# Patient Record
Sex: Male | Born: 1970 | Race: White | Hispanic: No | Marital: Married | Smoking: Former smoker
Health system: Southern US, Community
[De-identification: ages and names within clinical notes are randomized; demographics above are authoritative.]

## PROBLEM LIST (undated history)

## (undated) DIAGNOSIS — F329 Major depressive disorder, single episode, unspecified: Secondary | ICD-10-CM

## (undated) DIAGNOSIS — R51 Headache: Secondary | ICD-10-CM

## (undated) DIAGNOSIS — Z872 Personal history of diseases of the skin and subcutaneous tissue: Secondary | ICD-10-CM

## (undated) DIAGNOSIS — J302 Other seasonal allergic rhinitis: Secondary | ICD-10-CM

## (undated) DIAGNOSIS — K760 Fatty (change of) liver, not elsewhere classified: Secondary | ICD-10-CM

## (undated) DIAGNOSIS — M1990 Unspecified osteoarthritis, unspecified site: Secondary | ICD-10-CM

## (undated) DIAGNOSIS — F419 Anxiety disorder, unspecified: Secondary | ICD-10-CM

## (undated) DIAGNOSIS — Z9889 Other specified postprocedural states: Secondary | ICD-10-CM

## (undated) DIAGNOSIS — I10 Essential (primary) hypertension: Secondary | ICD-10-CM

## (undated) DIAGNOSIS — L409 Psoriasis, unspecified: Secondary | ICD-10-CM

## (undated) DIAGNOSIS — D496 Neoplasm of unspecified behavior of brain: Secondary | ICD-10-CM

## (undated) DIAGNOSIS — N159 Renal tubulo-interstitial disease, unspecified: Secondary | ICD-10-CM

## (undated) DIAGNOSIS — R220 Localized swelling, mass and lump, head: Secondary | ICD-10-CM

## (undated) DIAGNOSIS — M7051 Other bursitis of knee, right knee: Secondary | ICD-10-CM

## (undated) DIAGNOSIS — Z87442 Personal history of urinary calculi: Secondary | ICD-10-CM

## (undated) HISTORY — DX: Neoplasm of unspecified behavior of brain: D49.6

## (undated) HISTORY — DX: Other seasonal allergic rhinitis: J30.2

## (undated) HISTORY — DX: Other bursitis of knee, right knee: M70.51

## (undated) HISTORY — DX: Anxiety disorder, unspecified: F41.9

## (undated) HISTORY — DX: Renal tubulo-interstitial disease, unspecified: N15.9

## (undated) HISTORY — DX: Other specified postprocedural states: Z98.890

## (undated) HISTORY — DX: Personal history of diseases of the skin and subcutaneous tissue: Z87.2

---
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
## 1997-12-10 DIAGNOSIS — R519 Headache, unspecified: Secondary | ICD-10-CM

## 1997-12-10 HISTORY — DX: Headache, unspecified: R51.9

## 2002-12-10 DIAGNOSIS — F419 Anxiety disorder, unspecified: Secondary | ICD-10-CM

## 2002-12-10 DIAGNOSIS — F32A Depression, unspecified: Secondary | ICD-10-CM

## 2002-12-10 DIAGNOSIS — I10 Essential (primary) hypertension: Secondary | ICD-10-CM

## 2002-12-10 HISTORY — DX: Essential (primary) hypertension: I10

## 2002-12-10 HISTORY — DX: Anxiety disorder, unspecified: F41.9

## 2002-12-10 HISTORY — DX: Depression, unspecified: F32.A

## 2007-12-11 DIAGNOSIS — J302 Other seasonal allergic rhinitis: Secondary | ICD-10-CM

## 2007-12-11 DIAGNOSIS — K760 Fatty (change of) liver, not elsewhere classified: Secondary | ICD-10-CM

## 2007-12-11 HISTORY — PX: HAND SURGERY: SHX662

## 2007-12-11 HISTORY — DX: Other seasonal allergic rhinitis: J30.2

## 2007-12-11 HISTORY — DX: Fatty (change of) liver, not elsewhere classified: K76.0

## 2012-07-03 ENCOUNTER — Ambulatory Visit: Payer: Self-pay | Admitting: Chiropractor

## 2012-12-10 DIAGNOSIS — M7051 Other bursitis of knee, right knee: Secondary | ICD-10-CM

## 2012-12-10 DIAGNOSIS — M1990 Unspecified osteoarthritis, unspecified site: Secondary | ICD-10-CM

## 2012-12-10 HISTORY — DX: Other bursitis of knee, right knee: M70.51

## 2012-12-10 HISTORY — DX: Unspecified osteoarthritis, unspecified site: M19.90

## 2013-12-10 DIAGNOSIS — Z87442 Personal history of urinary calculi: Secondary | ICD-10-CM

## 2013-12-10 DIAGNOSIS — L409 Psoriasis, unspecified: Secondary | ICD-10-CM

## 2013-12-10 HISTORY — DX: Psoriasis, unspecified: L40.9

## 2013-12-10 HISTORY — DX: Personal history of urinary calculi: Z87.442

## 2016-01-24 ENCOUNTER — Encounter: Payer: Self-pay | Admitting: Emergency Medicine

## 2016-01-24 ENCOUNTER — Emergency Department: Payer: PRIVATE HEALTH INSURANCE

## 2016-01-24 ENCOUNTER — Emergency Department
Admission: EM | Admit: 2016-01-24 | Discharge: 2016-01-24 | Disposition: A | Discharge status: Home / Self Care | Payer: PRIVATE HEALTH INSURANCE | Attending: Emergency Medicine | Admitting: Emergency Medicine

## 2016-01-24 DIAGNOSIS — N201 Calculus of ureter: Secondary | ICD-10-CM | POA: Diagnosis not present

## 2016-01-24 DIAGNOSIS — I10 Essential (primary) hypertension: Secondary | ICD-10-CM | POA: Insufficient documentation

## 2016-01-24 DIAGNOSIS — R109 Unspecified abdominal pain: Secondary | ICD-10-CM | POA: Diagnosis present

## 2016-01-24 DIAGNOSIS — Z87891 Personal history of nicotine dependence: Secondary | ICD-10-CM | POA: Diagnosis not present

## 2016-01-24 DIAGNOSIS — Z880 Allergy status to penicillin: Secondary | ICD-10-CM | POA: Diagnosis not present

## 2016-01-24 HISTORY — DX: Essential (primary) hypertension: I10

## 2016-01-24 LAB — URINALYSIS COMPLETE WITH MICROSCOPIC (ARMC ONLY)
BACTERIA UA: NONE SEEN
Bilirubin Urine: NEGATIVE
Glucose, UA: NEGATIVE mg/dL
KETONES UR: NEGATIVE mg/dL
Leukocytes, UA: NEGATIVE
Nitrite: NEGATIVE
PH: 6 (ref 5.0–8.0)
PROTEIN: NEGATIVE mg/dL
Specific Gravity, Urine: 1.014 (ref 1.005–1.030)

## 2016-01-24 LAB — BASIC METABOLIC PANEL
Anion gap: 6 (ref 5–15)
BUN: 17 mg/dL (ref 6–20)
CHLORIDE: 103 mmol/L (ref 101–111)
CO2: 29 mmol/L (ref 22–32)
CREATININE: 1.32 mg/dL — AB (ref 0.61–1.24)
Calcium: 9.3 mg/dL (ref 8.9–10.3)
GFR calc Af Amer: >60 mL/min (ref >60)
GFR calc non Af Amer: >60 mL/min (ref >60)
GLUCOSE: 103 mg/dL — AB (ref 65–99)
POTASSIUM: 4.3 mmol/L (ref 3.5–5.1)
Sodium: 138 mmol/L (ref 135–145)

## 2016-01-24 LAB — CBC
HCT: 45.5 % (ref 40.0–52.0)
HEMOGLOBIN: 15.7 g/dL (ref 13.0–18.0)
MCH: 31.3 pg (ref 26.0–34.0)
MCHC: 34.6 g/dL (ref 32.0–36.0)
MCV: 90.6 fL (ref 80.0–100.0)
PLATELETS: 509 10*3/uL — AB (ref 150–440)
RBC: 5.03 MIL/uL (ref 4.40–5.90)
RDW: 13 % (ref 11.5–14.5)
WBC: 14.8 10*3/uL — ABNORMAL HIGH (ref 3.8–10.6)

## 2016-01-24 MED ORDER — TAMSULOSIN HCL 0.4 MG PO CAPS: 0.4000 mg | ORAL_CAPSULE | Freq: Every day | ORAL | Status: DC

## 2016-01-24 MED ORDER — ONDANSETRON 4 MG PO TBDP
ORAL_TABLET | ORAL | Status: AC
  Filled 2016-01-24: qty 1

## 2016-01-24 MED ORDER — MORPHINE SULFATE (PF) 4 MG/ML IV SOLN
4.0000 mg | Freq: Once | INTRAVENOUS | Status: AC
  Administered 2016-01-24: 4 mg via INTRAVENOUS

## 2016-01-24 MED ORDER — MORPHINE SULFATE (PF) 4 MG/ML IV SOLN
INTRAVENOUS | Status: AC
  Administered 2016-01-24: 4 mg via INTRAVENOUS
  Filled 2016-01-24: qty 1

## 2016-01-24 MED ORDER — SODIUM CHLORIDE 0.9 % IV BOLUS (SEPSIS)
1000.0000 mL | Freq: Once | INTRAVENOUS | Status: AC
  Administered 2016-01-24: 1000 mL via INTRAVENOUS

## 2016-01-24 MED ORDER — KETOROLAC TROMETHAMINE 30 MG/ML IJ SOLN
INTRAMUSCULAR | Status: AC
Start: 2016-01-24 — End: 2016-01-24
  Administered 2016-01-24: 30 mg via INTRAVENOUS
  Filled 2016-01-24: qty 1

## 2016-01-24 MED ORDER — OXYCODONE-ACETAMINOPHEN 5-325 MG PO TABS: 1.0000 | ORAL_TABLET | Freq: Four times a day (QID) | ORAL | Status: DC | PRN

## 2016-01-24 MED ORDER — ONDANSETRON 4 MG PO TBDP
4.0000 mg | ORAL_TABLET | Freq: Once | ORAL | Status: AC
  Administered 2016-01-24: 4 mg via ORAL

## 2016-01-24 MED ORDER — KETOROLAC TROMETHAMINE 30 MG/ML IJ SOLN
30.0000 mg | Freq: Once | INTRAMUSCULAR | Status: AC
  Administered 2016-01-24: 30 mg via INTRAVENOUS

## 2016-01-24 NOTE — Discharge Instructions (Signed)
As we discussed please arrange an appointment with urology by calling the number provided if her symptoms do not resolve in the next several days. Please take your pain medication as prescribed, as needed. You may also take Aleve/Advil/Motrin. Return to the emergency department for any increased pain, vomiting, or fever.   Kidney Stones Kidney stones (urolithiasis) are deposits that form inside your kidneys. The intense pain is caused by the stone moving through the urinary tract. When the stone moves, the ureter goes into spasm around the stone. The stone is usually passed in the urine.  CAUSES   A disorder that makes certain neck glands produce too much parathyroid hormone (primary hyperparathyroidism).  A buildup of uric acid crystals, similar to gout in your joints.  Narrowing (stricture) of the ureter.  A kidney obstruction present at birth (congenital obstruction).  Previous surgery on the kidney or ureters.  Numerous kidney infections. SYMPTOMS   Feeling sick to your stomach (nauseous).  Throwing up (vomiting).  Blood in the urine (hematuria).  Pain that usually spreads (radiates) to the groin.  Frequency or urgency of urination. DIAGNOSIS   Taking a history and physical exam.  Blood or urine tests.  CT scan.  Occasionally, an examination of the inside of the urinary bladder (cystoscopy) is performed. TREATMENT   Observation.  Increasing your fluid intake.  Extracorporeal shock wave lithotripsy--This is a noninvasive procedure that uses shock waves to break up kidney stones.  Surgery may be needed if you have severe pain or persistent obstruction. There are various surgical procedures. Most of the procedures are performed with the use of small instruments. Only small incisions are needed to accommodate these instruments, so recovery time is minimized. The size, location, and chemical composition are all important variables that will determine the proper choice of  action for you. Talk to your health care provider to better understand your situation so that you will minimize the risk of injury to yourself and your kidney.  HOME CARE INSTRUCTIONS   Drink enough water and fluids to keep your urine clear or pale yellow. This will help you to pass the stone or stone fragments.  Strain all urine through the provided strainer. Keep all particulate matter and stones for your health care provider to see. The stone causing the pain may be as small as a grain of salt. It is very important to use the strainer each and every time you pass your urine. The collection of your stone will allow your health care provider to analyze it and verify that a stone has actually passed. The stone analysis will often identify what you can do to reduce the incidence of recurrences.  Only take over-the-counter or prescription medicines for pain, discomfort, or fever as directed by your health care provider.  Keep all follow-up visits as told by your health care provider. This is important.  Get follow-up X-rays if required. The absence of pain does not always mean that the stone has passed. It may have only stopped moving. If the urine remains completely obstructed, it can cause loss of kidney function or even complete destruction of the kidney. It is your responsibility to make sure X-rays and follow-ups are completed. Ultrasounds of the kidney can show blockages and the status of the kidney. Ultrasounds are not associated with any radiation and can be performed easily in a matter of minutes.  Make changes to your daily diet as told by your health care provider. You may be told to:  Limit the  amount of salt that you eat.  Eat 5 or more servings of fruits and vegetables each day.  Limit the amount of meat, poultry, fish, and eggs that you eat.  Collect a 24-hour urine sample as told by your health care provider.You may need to collect another urine sample every 6-12 months. SEEK  MEDICAL CARE IF:  You experience pain that is progressive and unresponsive to any pain medicine you have been prescribed. SEEK IMMEDIATE MEDICAL CARE IF:   Pain cannot be controlled with the prescribed medicine.  You have a fever or shaking chills.  The severity or intensity of pain increases over 18 hours and is not relieved by pain medicine.  You develop a new onset of abdominal pain.  You feel faint or pass out.  You are unable to urinate.   This information is not intended to replace advice given to you by your health care provider. Make sure you discuss any questions you have with your health care provider.   Document Released: 11/26/2005 Document Revised: 08/17/2015 Document Reviewed: 04/29/2013 Elsevier Interactive Patient Education [employer].

## 2016-01-24 NOTE — ED Provider Notes (Signed)
[facility] Emergency Department Provider Note  Time seen: 10:13 PM  I have reviewed the triage vital signs and the nursing notes.   HISTORY  Chief Complaint Flank Pain and Abdominal Pain    HPI Barry Rios is a 45 y.o. male with a past medical history of hypertension, kidney stones presents to the emergency department with right flank pain. According to the patient he developed right flank pain today, with nausea and vomiting. History of kidney stones in the past which this feels identical. Denies fever. Denies diarrhea. Denies hematuria but did state mild dysuria. Describes the pain as severe sharp stabbing pain to the right flank.     Past Medical History  Diagnosis Date  . Kidney stone   . Hypertension     There are no active problems to display for this patient.   Past Surgical History  Procedure Laterality Date  . Hand surgery      No current outpatient prescriptions on file.  Allergies Penicillins  No family history on file.  Social History Social History  Substance Use Topics  . Smoking status: Former Smoker    Types: Cigarettes  . Smokeless tobacco: None  . Alcohol Use: Yes     Comment: rare    Review of Systems Constitutional: Negative for fever. Cardiovascular: Negative for chest pain. Respiratory: Negative for shortness of breath. Gastrointestinal: Right flank pain. Positive for nausea and vomiting. Negative diarrhea Genitourinary: Mild dysuria. Negative for hematuria. Musculoskeletal: Negative for back pain. Neurological: Negative for headache 10-point ROS otherwise negative.  ____________________________________________   PHYSICAL EXAM:  VITAL SIGNS: ED Triage Vitals  Enc Vitals Group     BP 01/24/16 1857 178/93 mmHg     Pulse Rate 01/24/16 1857 72     Resp 01/24/16 1857 16     Temp 01/24/16 1857 97.9 F (36.6 C)     Temp Source 01/24/16 1857 Oral     SpO2 01/24/16 1857 99 %     Weight 01/24/16 1857 255  lb (115.667 kg)     Height 01/24/16 1857 6' (1.829 m)     Head Cir --      Peak Flow --      Pain Score 01/24/16 1857 8     Pain Loc --      Pain Edu? --      Excl. in [place]? --     Constitutional: Alert and oriented. Well appearing and in no distress. Eyes: Normal exam ENT   Head: Normocephalic and atraumatic.   Mouth/Throat: Mucous membranes are moist. Cardiovascular: Normal rate, regular rhythm. No murmur Respiratory: Normal respiratory effort without tachypnea nor retractions. Breath sounds are clear Gastrointestinal: Soft, minimal right flank tenderness palpation. Mild right CVA tenderness Musculoskeletal: Nontender with normal range of motion in all extremities Neurologic:  Normal speech and language. No gross focal neurologic deficits  Skin:  Skin is warm, dry and intact.  Psychiatric: Mood and affect are normal. Speech and behavior are normal.  ____________________________________________   INITIAL IMPRESSION / ASSESSMENT AND PLAN / ED COURSE  Pertinent labs & imaging results that were available during my care of the patient were reviewed by me and considered in my medical decision making (see chart for details).  Patient presents her right flank pain. Severe in nature, nausea and vomiting. Patient has a history of kidney stones in the past which this feels similar. Patient's labs show a mild creatinine elevation with leukocytosis. CT consistent with a 3 x 4 mm proximal right ureteral  stone. After Toradol and morphine the patient states his pain is gone. I discussed follow-up with urology, patient is agreeable. Discussed strict return precautions for worsening pain, vomiting, or fever. Patient agreeable.  ____________________________________________   FINAL CLINICAL IMPRESSION(S) / ED DIAGNOSES  Ureterolithiasis   Harvest Dark, MD 01/24/16 2217

## 2016-01-24 NOTE — ED Notes (Signed)
Lab called.  cbc redraw.  Iv started and pt placed in subwait.

## 2016-01-24 NOTE — ED Notes (Signed)
Right flank pain, RLQ pain, burning with urination and hx of kidney stones.

## 2016-02-02 ENCOUNTER — Encounter: Payer: Self-pay | Admitting: Obstetrics and Gynecology

## 2016-02-02 ENCOUNTER — Ambulatory Visit (INDEPENDENT_AMBULATORY_CARE_PROVIDER_SITE_OTHER): Payer: PRIVATE HEALTH INSURANCE | Admitting: Obstetrics and Gynecology

## 2016-02-02 VITALS — BP 138/84 | HR 71 | Ht 72.0 in | Wt 264.0 lb

## 2016-02-02 DIAGNOSIS — N200 Calculus of kidney: Secondary | ICD-10-CM

## 2016-02-02 NOTE — Progress Notes (Signed)
02/02/2016 3:31 PM   Barry Rios 1971-05-23 [id number]  Referring provider: Tamsen Roers, MD [address], [place] [address]  Chief Complaint  Patient presents with  . Nephrolithiasis    New Patient    HPI: Patient is a 45 year old male presenting today for follow-up after being seen in the emergency department on 01/24/16 for acute onset of right flank pain. Obstructing 3 x 4 mm proximal right ureteral stone with resultant mild hydronephrosis, renal edema and perinephric stranding. No additional nephro or ureterolithiasis identified.   This is 2nd kidney stone episode.    Continued right flank pain now radiating to right lower abdomen. No fevers no vomiting.   No gross hematuria.   Reports mild dysuria and frequency.  PMH: Past Medical History  Diagnosis Date  . Kidney stone   . Hypertension   . Anxiety   . Kidney infection     when he was a child- hospitalized    Surgical History: Past Surgical History  Procedure Laterality Date  . Hand surgery Left 2009    Home Medications:    Medication List       This list is accurate as of: 02/02/16  3:31 PM.  Always use your most recent med list.               GARLIC [id number] PO  Take by mouth.     KRILL OIL PO  Take by mouth.     multivitamin capsule  Take 1 capsule by mouth daily.     naproxen sodium 220 MG tablet  Commonly known as:  ANAPROX  Take 220 mg by mouth 2 (two) times daily with a meal.     oxyCODONE-acetaminophen 5-325 MG tablet  Commonly known as:  ROXICET  Take 1 tablet by mouth every 6 (six) hours as needed.     sertraline 100 MG tablet  Commonly known as:  ZOLOFT  Take 100 mg by mouth daily.     tamsulosin 0.4 MG Caps capsule  Commonly known as:  FLOMAX  Take 1 capsule (0.4 mg total) by mouth daily.        Allergies:  Allergies  Allergen Reactions  . Penicillins Hives    Family History: Family History  Problem Relation Age of Onset  . Bladder Cancer Neg Hx   . Kidney  cancer Neg Hx   . Prostate cancer Neg Hx   . Nephrolithiasis Brother     Social History:  reports that he has quit smoking. His smoking use included Cigarettes. He does not have any smokeless tobacco history on file. He reports that he drinks alcohol. He reports that he does not use illicit drugs.  ROS: UROLOGY Frequent Urination?: Yes Hard to postpone urination?: No Burning/pain with urination?: Yes Get up at night to urinate?: Yes Leakage of urine?: Yes Urine stream starts and stops?: No Trouble starting stream?: No Do you have to strain to urinate?: No Blood in urine?: No Urinary tract infection?: No Sexually transmitted disease?: No Injury to kidneys or bladder?: No Painful intercourse?: No Weak stream?: No Erection problems?: No Penile pain?: Yes  Gastrointestinal Nausea?: Yes Vomiting?: Yes Indigestion/heartburn?: No Diarrhea?: No Constipation?: Yes  Constitutional Fever: No Night sweats?: Yes Weight loss?: No Fatigue?: Yes  Skin Skin rash/lesions?: Yes Itching?: No  Eyes Blurred vision?: No Double vision?: No  Ears/Nose/Throat Sore throat?: No Sinus problems?: Yes  Hematologic/Lymphatic Swollen glands?: No Easy bruising?: No  Cardiovascular Leg swelling?: No Chest pain?: No  Respiratory Cough?: No  Shortness of breath?: No  Endocrine Excessive thirst?: Yes  Musculoskeletal Back pain?: No Joint pain?: Yes  Neurological Headaches?: No Dizziness?: No  Psychologic Depression?: Yes Anxiety?: Yes  Physical Exam: BP 138/84 mmHg  Pulse 71  Ht 6' (1.829 m)  Wt 264 lb (119.75 kg)  BMI 35.80 kg/m2  Constitutional:  Alert and oriented, No acute distress. HEENT: [place] AT, moist mucus membranes.  Trachea midline, no masses. Cardiovascular: No clubbing, cyanosis, or edema. Respiratory: Normal respiratory effort, no increased work of breathing. GI: Abdomen is soft, nontender, nondistended, no abdominal masses GU: mild right CVA  tenderness. Skin: No rashes, bruises or suspicious lesions. Lymph: No cervical or inguinal adenopathy. Neurologic: Grossly intact, no focal deficits, moving all 4 extremities. Psychiatric: Normal mood and affect.  Laboratory Data:  Lab Results  Component Value Date   WBC 14.8* 01/24/2016   HGB 15.7 01/24/2016   HCT 45.5 01/24/2016   MCV 90.6 01/24/2016   PLT 509* 01/24/2016    Lab Results  Component Value Date   CREATININE 1.32* 01/24/2016    No results found for: PSA  No results found for: TESTOSTERONE  No results found for: HGBA1C  Urinalysis    Component Value Date/Time   COLORURINE YELLOW* 01/24/2016 1926   APPEARANCEUR CLEAR* 01/24/2016 1926   LABSPEC 1.014 01/24/2016 1926   PHURINE 6.0 01/24/2016 1926   GLUCOSEU NEGATIVE 01/24/2016 1926   HGBUR 1+* 01/24/2016 1926   BILIRUBINUR NEGATIVE 01/24/2016 1926   KETONESUR NEGATIVE 01/24/2016 1926   PROTEINUR NEGATIVE 01/24/2016 1926   NITRITE NEGATIVE 01/24/2016 1926   LEUKOCYTESUR NEGATIVE 01/24/2016 1926    Pertinent Imaging: CLINICAL DATA: 45 year old male with right flank and right lower quadrant pain accompanied by this urea.  EXAM: CT ABDOMEN AND PELVIS WITHOUT CONTRAST  TECHNIQUE: Multidetector CT imaging of the abdomen and pelvis was performed following the standard protocol without IV contrast.  COMPARISON: Prior CT abdomen/ pelvis 04/05/2014  FINDINGS: Lower Chest: The lung bases are clear. Visualized cardiac structures are within normal limits for size. No pericardial effusion. Unremarkable visualized distal thoracic esophagus.  Abdomen: Unenhanced CT was performed per clinician order. Lack of IV contrast limits sensitivity and specificity, especially for evaluation of abdominal/pelvic solid viscera. Within these limitations, unremarkable CT appearance of the stomach, duodenum, spleen, adrenal glands and pancreas. Normal hepatic contour and morphology. Diffuse low attenuation of the  hepatic parenchyma consistent with at least moderate hepatic steatosis. Trace sparing around the gallbladder fossa. Gallbladder is unremarkable. No intra or extrahepatic biliary ductal dilatation.  Mild right hydronephrosis with renal edema and perinephric stranding. Approximately 3 by 4 mm stone in the proximal right ureter. No additional nephrolithiasis.  No evidence of obstruction or focal bowel wall thickening. Normal appendix in the right lower quadrant. The terminal ileum is unremarkable. No free fluid or suspicious adenopathy.  Pelvis: Unremarkable bladder, prostate gland and seminal vesicles. No free fluid or suspicious adenopathy.  Musculoskeletal: No acute fracture or aggressive appearing lytic or blastic osseous lesion.  Vascular: Limited evaluation in the absence of intravenous contrast. No aneurysm or significant atherosclerotic vascular calcification.  IMPRESSION: 1. Obstructing 3 x 4 mm proximal right ureteral stone with resultant mild hydronephrosis, renal edema and perinephric stranding. 2. No additional nephro or ureterolithiasis identified. 3. Moderate hepatic steatosis.  Electronically Signed  By: Jacqulynn Cadet M.D.  On: 01/24/2016 21:26  Assessment & Plan:    1. Nephrolithiasis- Obstructing 3 x 4 mm proximal right ureteral stone with resultant mild hydronephrosis, renal edema and perinephric stranding. Patient advised  to continue Flomax, pain medication as needed and to push fluids. He was advised to seek immediate medical attention for fevers, uncontrolled pain or vomiting. He will follow up in 2 weeks with a KUB and renal ultrasound prior. - Urinalysis, Complete   Return for have KUB/RUS performed in 2 weeks then f/u for results.  These notes generated with voice recognition software. I apologize for typographical errors.  Herbert Moors, [facility] [address], [place], [place]  [id number] [phone]

## 2016-02-02 NOTE — Patient Instructions (Signed)
Dietary Guidelines to Help Prevent Kidney Stones °Your risk of kidney stones can be decreased by adjusting the foods you eat. The most important thing you can do is drink enough fluid. You should drink enough fluid to keep your urine clear or pale yellow. The following guidelines provide specific information for the type of kidney stone you have had. °GUIDELINES ACCORDING TO TYPE OF KIDNEY STONE °Calcium Oxalate Kidney Stones °· Reduce the amount of salt you eat. Foods that have a lot of salt cause your body to release excess calcium into your urine. The excess calcium can combine with a substance called oxalate to form kidney stones. °· Reduce the amount of animal protein you eat if the amount you eat is excessive. Animal protein causes your body to release excess calcium into your urine. Ask your dietitian how much protein from animal sources you should be eating. °· Avoid foods that are high in oxalates. If you take vitamins, they should have less than 500 mg of vitamin C. Your body turns vitamin C into oxalates. You do not need to avoid fruits and vegetables high in vitamin C. °Calcium Phosphate Kidney Stones °· Reduce the amount of salt you eat to help prevent the release of excess calcium into your urine. °· Reduce the amount of animal protein you eat if the amount you eat is excessive. Animal protein causes your body to release excess calcium into your urine. Ask your dietitian how much protein from animal sources you should be eating. °· Get enough calcium from food or take a calcium supplement (ask your dietitian for recommendations). Food sources of calcium that do not increase your risk of kidney stones include: °· Broccoli. °· Dairy products, such as cheese and yogurt. °· Pudding. °Uric Acid Kidney Stones °· Do not have more than 6 oz of animal protein per day. °FOOD SOURCES °Animal Protein Sources °· Meat (all types). °· Poultry. °· Eggs. °· Fish, seafood. °Foods High in Salt °· Salt seasonings. °· Soy  sauce. °· Teriyaki sauce. °· Cured and processed meats. °· Salted crackers and snack foods. °· Fast food. °· Canned soups and most canned foods. °Foods High in Oxalates °· Grains: °· Amaranth. °· Barley. °· Grits. °· Wheat germ. °· Bran. °· Buckwheat flour. °· All bran cereals. °· Pretzels. °· Whole wheat bread. °· Vegetables: °· Beans (wax). °· Beets and beet greens. °· Collard greens. °· Eggplant. °· Escarole. °· Leeks. °· Okra. °· Parsley. °· Rutabagas. °· Spinach. °· Swiss chard. °· Tomato paste. °· Fried potatoes. °· Sweet potatoes. °· Fruits: °· Red currants. °· Figs. °· Kiwi. °· Rhubarb. °· Meat and Other Protein Sources: °· Beans (dried). °· Soy burgers and other soybean products. °· Miso. °· Nuts (peanuts, almonds, pecans, cashews, hazelnuts). °· Nut butters. °· Sesame seeds and tahini (paste made of sesame seeds). °· Poppy seeds. °· Beverages: °· Chocolate drink mixes. °· Soy milk. °· Instant iced tea. °· Juices made from high-oxalate fruits or vegetables. °· Other: °· Carob. °· Chocolate. °· Fruitcake. °· Marmalades. °  °This information is not intended to replace advice given to you by your health care provider. Make sure you discuss any questions you have with your health care provider. °  °Document Released: 03/23/2011 Document Revised: 12/01/2013 Document Reviewed: 10/23/2013 °Elsevier Interactive Patient Education ©2016 [employer] ° °Kidney Stones °Kidney stones (urolithiasis) are deposits that form inside your kidneys. The intense pain is caused by the stone moving through the urinary tract. When the stone moves, the ureter   goes into spasm around the stone. The stone is usually passed in the urine.  °CAUSES  °· A disorder that makes certain neck glands produce too much parathyroid hormone (primary hyperparathyroidism). °· A buildup of uric acid crystals, similar to gout in your joints. °· Narrowing (stricture) of the ureter. °· A kidney obstruction present at birth (congenital  obstruction). °· Previous surgery on the kidney or ureters. °· Numerous kidney infections. °SYMPTOMS  °· Feeling sick to your stomach (nauseous). °· Throwing up (vomiting). °· Blood in the urine (hematuria). °· Pain that usually spreads (radiates) to the groin. °· Frequency or urgency of urination. °DIAGNOSIS  °· Taking a history and physical exam. °· Blood or urine tests. °· CT scan. °· Occasionally, an examination of the inside of the urinary bladder (cystoscopy) is performed. °TREATMENT  °· Observation. °· Increasing your fluid intake. °· Extracorporeal shock wave lithotripsy--This is a noninvasive procedure that uses shock waves to break up kidney stones. °· Surgery may be needed if you have severe pain or persistent obstruction. There are various surgical procedures. Most of the procedures are performed with the use of small instruments. Only small incisions are needed to accommodate these instruments, so recovery time is minimized. °The size, location, and chemical composition are all important variables that will determine the proper choice of action for you. Talk to your health care provider to better understand your situation so that you will minimize the risk of injury to yourself and your kidney.  °HOME CARE INSTRUCTIONS  °· Drink enough water and fluids to keep your urine clear or pale yellow. This will help you to pass the stone or stone fragments. °· Strain all urine through the provided strainer. Keep all particulate matter and stones for your health care provider to see. The stone causing the pain may be as small as a grain of salt. It is very important to use the strainer each and every time you pass your urine. The collection of your stone will allow your health care provider to analyze it and verify that a stone has actually passed. The stone analysis will often identify what you can do to reduce the incidence of recurrences. °· Only take over-the-counter or prescription medicines for pain,  discomfort, or fever as directed by your health care provider. °· Keep all follow-up visits as told by your health care provider. This is important. °· Get follow-up X-rays if required. The absence of pain does not always mean that the stone has passed. It may have only stopped moving. If the urine remains completely obstructed, it can cause loss of kidney function or even complete destruction of the kidney. It is your responsibility to make sure X-rays and follow-ups are completed. Ultrasounds of the kidney can show blockages and the status of the kidney. Ultrasounds are not associated with any radiation and can be performed easily in a matter of minutes. °· Make changes to your daily diet as told by your health care provider. You may be told to: °¨ Limit the amount of salt that you eat. °¨ Eat 5 or more servings of fruits and vegetables each day. °¨ Limit the amount of meat, poultry, fish, and eggs that you eat. °· Collect a 24-hour urine sample as told by your health care provider. You may need to collect another urine sample every 6-12 months. °SEEK MEDICAL CARE IF: °· You experience pain that is progressive and unresponsive to any pain medicine you have been prescribed. °SEEK IMMEDIATE MEDICAL CARE IF:  °· Pain   Pain cannot be controlled with the prescribed medicine. °· You have a fever or shaking chills. °· The severity or intensity of pain increases over 18 hours and is not relieved by pain medicine. °· You develop a new onset of abdominal pain. °· You feel faint or pass out. °· You are unable to urinate. °  °This information is not intended to replace advice given to you by your health care provider. Make sure you discuss any questions you have with your health care provider. °  °Document Released: 11/26/2005 Document Revised: 08/17/2015 Document Reviewed: 04/29/2013 °Elsevier Interactive Patient Education ©2016 Else[employer] Inc[employer] ° °

## 2016-02-03 ENCOUNTER — Telehealth: Payer: Self-pay | Admitting: Radiology

## 2016-02-03 LAB — URINALYSIS, COMPLETE
BILIRUBIN UA: NEGATIVE
GLUCOSE, UA: NEGATIVE
KETONES UA: NEGATIVE
LEUKOCYTES UA: NEGATIVE
Nitrite, UA: NEGATIVE
PROTEIN UA: NEGATIVE
SPEC GRAV UA: 1.02 (ref 1.005–1.030)
Urobilinogen, Ur: 0.2 mg/dL (ref 0.2–1.0)
pH, UA: 5.5 (ref 5.0–7.5)

## 2016-02-03 LAB — MICROSCOPIC EXAMINATION
Bacteria, UA: NONE SEEN
EPITHELIAL CELLS (NON RENAL): NONE SEEN /HPF (ref 0–10)

## 2016-02-03 NOTE — Telephone Encounter (Signed)
Pt called stating he passed his stone last night & is no longer having any pain.  Per Dr Erlene Quan advised pt that he no longer needs a RUS & KUB & to RTC as needed. Pt voices understanding & states he saved the stone & will bring it to the office.

## 2016-02-16 ENCOUNTER — Ambulatory Visit: Payer: PRIVATE HEALTH INSURANCE | Admitting: Obstetrics and Gynecology

## 2016-03-08 ENCOUNTER — Other Ambulatory Visit: Payer: Self-pay | Admitting: Obstetrics and Gynecology

## 2016-04-16 ENCOUNTER — Telehealth: Payer: Self-pay | Admitting: Urology

## 2016-04-16 ENCOUNTER — Encounter: Payer: Self-pay | Admitting: Urology

## 2016-04-16 NOTE — Telephone Encounter (Signed)
Spoke with pt in reference to stone composition. Pt voiced understanding. 

## 2016-04-16 NOTE — Telephone Encounter (Signed)
Patient's stone analysis noted a stone composition with 80% Calcium Oxalate Monohydrate, 16% Calcium Oxalate Dihydrate and 16% Calcium Phosphate.  He needs to increase his water to 2.5 L daily, reduce sodium and increase citrus juices.

## 2016-04-16 NOTE — Telephone Encounter (Signed)
LMOM

## 2016-12-20 ENCOUNTER — Encounter (HOSPITAL_COMMUNITY): Payer: Self-pay | Admitting: Emergency Medicine

## 2016-12-20 ENCOUNTER — Ambulatory Visit (HOSPITAL_COMMUNITY)
Admission: EM | Admit: 2016-12-20 | Discharge: 2016-12-20 | Disposition: A | Discharge status: Home / Self Care | Payer: PRIVATE HEALTH INSURANCE | Attending: Emergency Medicine | Admitting: Emergency Medicine

## 2016-12-20 DIAGNOSIS — B9789 Other viral agents as the cause of diseases classified elsewhere: Secondary | ICD-10-CM

## 2016-12-20 DIAGNOSIS — J069 Acute upper respiratory infection, unspecified: Secondary | ICD-10-CM

## 2016-12-20 MED ORDER — AZITHROMYCIN 250 MG PO TABS: 250.0000 mg | ORAL_TABLET | Freq: Every day | ORAL | 0 refills | Status: DC

## 2016-12-20 NOTE — ED Triage Notes (Signed)
Cold symptoms have somewhat resolved, but feeling particularly drained.

## 2016-12-20 NOTE — ED Provider Notes (Signed)
CSN: [id number]     Arrival date & time 12/20/16  1725 History   None    Chief Complaint  Patient presents with  . Cough   (Consider location/radiation/quality/duration/timing/severity/associated sxs/prior Treatment) Patient having URI sx's   The history is provided by the patient.  Cough  Cough characteristics:  Productive Sputum characteristics:  White Severity:  Mild Onset quality:  Sudden Duration:  4 days Timing:  Constant Associated symptoms: rhinorrhea     Past Medical History:  Diagnosis Date  . Anxiety   . Hypertension   . Kidney infection    when he was a child- hospitalized  . Kidney stone    Past Surgical History:  Procedure Laterality Date  . HAND SURGERY Left 2009   Family History  Problem Relation Age of Onset  . Nephrolithiasis Brother   . Bladder Cancer Neg Hx   . Kidney cancer Neg Hx   . Prostate cancer Neg Hx    Social History  Substance Use Topics  . Smoking status: Former Smoker    Types: Cigarettes  . Smokeless tobacco: Not on file  . Alcohol use 0.0 oz/week     Comment: rare    Review of Systems  Constitutional: Positive for fatigue.  HENT: Positive for rhinorrhea.   Eyes: Negative.   Respiratory: Positive for cough.   Cardiovascular: Negative.   Gastrointestinal: Negative.   Endocrine: Negative.   Musculoskeletal: Negative.   Allergic/Immunologic: Negative.   Neurological: Negative.   Hematological: Negative.   Psychiatric/Behavioral: Negative.     Allergies  Penicillins  Home Medications   Prior to Admission medications   Medication Sig Start Date End Date Taking? Authorizing Provider  GARLIC 99991111 PO Take by mouth.   Yes Historical Provider, MD  KRILL OIL PO Take by mouth.   Yes Historical Provider, MD  Multiple Vitamin (MULTIVITAMIN) capsule Take 1 capsule by mouth daily.   Yes Historical Provider, MD  sertraline (ZOLOFT) 100 MG tablet Take 100 mg by mouth daily.   Yes Historical Provider, MD  azithromycin  (ZITHROMAX) 250 MG tablet Take 1 tablet (250 mg total) by mouth daily. Take first 2 tablets together, then 1 every day until finished. 12/20/16   Lysbeth Penner, FNP  naproxen sodium (ANAPROX) 220 MG tablet Take 220 mg by mouth 2 (two) times daily with a meal.    Historical Provider, MD  oxyCODONE-acetaminophen (ROXICET) 5-325 MG tablet Take 1 tablet by mouth every 6 (six) hours as needed. 01/24/16   Harvest Dark, MD  tamsulosin (FLOMAX) 0.4 MG CAPS capsule Take 1 capsule (0.4 mg total) by mouth daily. 01/24/16   Harvest Dark, MD   Meds Ordered and Administered this Visit  Medications - No data to display  BP 120/70 (BP Location: Left Arm)   Pulse 71   Temp 98.4 F (36.9 C) (Oral)   Resp 20   SpO2 97%  No data found.   Physical Exam  Urgent Care Course   Clinical Course     Procedures (including critical care time)  Labs Review Labs Reviewed - No data to display  Imaging Review No results found.   Visual Acuity Review  Right Eye Distance:   Left Eye Distance:   Bilateral Distance:    Right Eye Near:   Left Eye Near:    Bilateral Near:         MDM   1. Viral URI with cough    zpak as directed Continue with Mucinex DM  Push po fluids, rest, tylenol  and motrin otc prn as directed for fever, arthralgias, and myalgias.  Follow up prn if sx's continue or persist.    Lysbeth Penner, FNP 12/20/16 [place], [place] 12/20/16 [phone]

## 2018-04-09 DIAGNOSIS — R220 Localized swelling, mass and lump, head: Secondary | ICD-10-CM

## 2018-04-09 HISTORY — DX: Localized swelling, mass and lump, head: R22.0

## 2018-04-16 ENCOUNTER — Other Ambulatory Visit: Payer: Self-pay

## 2018-04-16 ENCOUNTER — Encounter (HOSPITAL_COMMUNITY): Payer: Self-pay | Admitting: Internal Medicine

## 2018-04-16 ENCOUNTER — Observation Stay (HOSPITAL_COMMUNITY)
Admission: AD | Admit: 2018-04-16 | Discharge: 2018-04-17 | Disposition: A | Discharge status: Home / Self Care | Payer: PRIVATE HEALTH INSURANCE | Source: Other Acute Inpatient Hospital | Attending: Family Medicine | Admitting: Family Medicine

## 2018-04-16 DIAGNOSIS — I10 Essential (primary) hypertension: Secondary | ICD-10-CM | POA: Diagnosis not present

## 2018-04-16 DIAGNOSIS — D432 Neoplasm of uncertain behavior of brain, unspecified: Secondary | ICD-10-CM | POA: Diagnosis not present

## 2018-04-16 DIAGNOSIS — D7589 Other specified diseases of blood and blood-forming organs: Secondary | ICD-10-CM | POA: Diagnosis not present

## 2018-04-16 DIAGNOSIS — D72829 Elevated white blood cell count, unspecified: Secondary | ICD-10-CM | POA: Diagnosis not present

## 2018-04-16 DIAGNOSIS — R220 Localized swelling, mass and lump, head: Secondary | ICD-10-CM | POA: Diagnosis not present

## 2018-04-16 DIAGNOSIS — F419 Anxiety disorder, unspecified: Secondary | ICD-10-CM | POA: Diagnosis not present

## 2018-04-16 HISTORY — DX: Unspecified osteoarthritis, unspecified site: M19.90

## 2018-04-16 HISTORY — DX: Personal history of urinary calculi: Z87.442

## 2018-04-16 HISTORY — DX: Localized swelling, mass and lump, head: R22.0

## 2018-04-16 HISTORY — DX: Major depressive disorder, single episode, unspecified: F32.9

## 2018-04-16 HISTORY — DX: Headache: R51

## 2018-04-16 MED ORDER — ONDANSETRON HCL 4 MG/2ML IJ SOLN: 4.0000 mg | Freq: Four times a day (QID) | INTRAMUSCULAR | Status: DC | PRN

## 2018-04-16 MED ORDER — TAMSULOSIN HCL 0.4 MG PO CAPS
0.4000 mg | ORAL_CAPSULE | Freq: Every day | ORAL | Status: DC
  Filled 2018-04-16: qty 1

## 2018-04-16 MED ORDER — ONDANSETRON HCL 4 MG PO TABS: 4.0000 mg | ORAL_TABLET | Freq: Four times a day (QID) | ORAL | Status: DC | PRN

## 2018-04-16 MED ORDER — METHOCARBAMOL 500 MG PO TABS: 500.0000 mg | ORAL_TABLET | Freq: Three times a day (TID) | ORAL | Status: DC | PRN

## 2018-04-16 MED ORDER — SERTRALINE HCL 100 MG PO TABS
100.0000 mg | ORAL_TABLET | Freq: Every day | ORAL | Status: DC
  Administered 2018-04-17: 100 mg via ORAL
  Filled 2018-04-16 (×2): qty 1

## 2018-04-16 MED ORDER — AMLODIPINE BESYLATE 5 MG PO TABS: 5.0000 mg | ORAL_TABLET | Freq: Every day | ORAL | Status: DC

## 2018-04-16 MED ORDER — OXYCODONE-ACETAMINOPHEN 5-325 MG PO TABS
1.0000 | ORAL_TABLET | Freq: Four times a day (QID) | ORAL | Status: DC | PRN
  Administered 2018-04-16 – 2018-04-17 (×2): 1 via ORAL
  Filled 2018-04-16 (×2): qty 1

## 2018-04-16 MED ORDER — BISACODYL 5 MG PO TBEC: 5.0000 mg | DELAYED_RELEASE_TABLET | Freq: Every day | ORAL | Status: DC | PRN

## 2018-04-16 MED ORDER — ACETAMINOPHEN 650 MG RE SUPP: 650.0000 mg | Freq: Four times a day (QID) | RECTAL | Status: DC | PRN

## 2018-04-16 MED ORDER — HYDRALAZINE HCL 20 MG/ML IJ SOLN: 5.0000 mg | INTRAMUSCULAR | Status: DC | PRN

## 2018-04-16 MED ORDER — SODIUM CHLORIDE 0.9 % IV SOLN: INTRAVENOUS | Status: DC

## 2018-04-16 MED ORDER — ACETAMINOPHEN 325 MG PO TABS
650.0000 mg | ORAL_TABLET | Freq: Four times a day (QID) | ORAL | Status: DC | PRN
  Administered 2018-04-16: 650 mg via ORAL
  Filled 2018-04-16: qty 2

## 2018-04-16 MED ORDER — DEXAMETHASONE 4 MG PO TABS
4.0000 mg | ORAL_TABLET | Freq: Three times a day (TID) | ORAL | Status: DC
  Administered 2018-04-16 – 2018-04-17 (×3): 4 mg via ORAL
  Filled 2018-04-16 (×3): qty 1

## 2018-04-16 MED ORDER — TRAZODONE HCL 50 MG PO TABS: 25.0000 mg | ORAL_TABLET | Freq: Every evening | ORAL | Status: DC | PRN

## 2018-04-16 NOTE — H&P (Signed)
History and Physical    Barry Rios URK:[id number] DOB: [birth year] DOA: 04/16/2018  PCP: Tamsen Roers, MD Patient coming from: [facility]  Chief Complaint: occipital mass/hypertension  HPI: Barry Rios is a 47 y.o. male with medical history significant for he stones and anxiety presents to [facility] emergency Department chief complaint headache blurred vision dizzinessand nausea for 2-3 weeks. Initial evaluation includes CT of the head revealing occipital mass and hypertension. History discussed with neurosurgery who recommended Decadron blood pressure control and transfer Home. [facility] are asked to admit  Information is obtained from the patient. He states over the last 2-3 weeks he has experienced intermittent right ear pain headache located at the base of his scull constant pressure-like. Associated symptoms include blurred vision in his right eye on occasion dizziness and nausea. He denies any emesis. He denies any numbness tingling of extremities. He denies any syncope near-syncope denies any weakness. Also reports some intermittent confusion/memory loss providing an example that he forgot how to get home when he was driving his car from work. He denies chest pain palpitation shortness of breath. He denies dysuria hematuria frequency or urgency. He denies any diarrhea constipation melena or bright red blood per rectum. He denies any fever chills recent travel or sick contacts unintentional weight loss.    ED Course: in the emergency department he received Decadron clonidine hydralazine. At the time of his admission he is afebrile hemodynamically stable and not hypoxic.  Review of Systems: As per HPI otherwise all other systems reviewed and are negative.   Ambulatory Status: ambulates independently is independent with ADLs  Past Medical History:  Diagnosis Date  . Anxiety   . Hypertension   . Kidney infection    when he was a child- hospitalized  . Kidney stone      Past Surgical History:  Procedure Laterality Date  . HAND SURGERY Left 2009    Social History   Socioeconomic History  . Marital status: Married    Spouse name: Not on file  . Number of children: Not on file  . Years of education: Not on file  . Highest education level: Not on file  Occupational History  . Not on file  Social Needs  . Financial resource strain: Not on file  . Food insecurity:    Worry: Not on file    Inability: Not on file  . Transportation needs:    Medical: Not on file    Non-medical: Not on file  Tobacco Use  . Smoking status: Former Smoker    Types: Cigarettes  Substance and Sexual Activity  . Alcohol use: Yes    Alcohol/week: 0.0 oz    Comment: rare  . Drug use: No  . Sexual activity: Not on file  Lifestyle  . Physical activity:    Days per week: Not on file    Minutes per session: Not on file  . Stress: Not on file  Relationships  . Social connections:    Talks on phone: Not on file    Gets together: Not on file    Attends religious service: Not on file    Active member of club or organization: Not on file    Attends meetings of clubs or organizations: Not on file    Relationship status: Not on file  . Intimate partner violence:    Fear of current or ex partner: Not on file    Emotionally abused: Not on file    Physically abused: Not on file  Forced sexual activity: Not on file  Other Topics Concern  . Not on file  Social History Narrative  . Not on file    Allergies  Allergen Reactions  . Penicillins Hives    Family History  Problem Relation Age of Onset  . Nephrolithiasis Brother   . Bladder Cancer Neg Hx   . Kidney cancer Neg Hx   . Prostate cancer Neg Hx     Prior to Admission medications   Medication Sig Start Date End Date Taking? Authorizing Provider  GARLIC 0962 PO Take by mouth.    [provider]  KRILL OIL PO Take by mouth.    [provider]  Multiple Vitamin (MULTIVITAMIN) capsule  Take 1 capsule by mouth daily.    [provider]  naproxen sodium (ANAPROX) 220 MG tablet Take 220 mg by mouth 2 (two) times daily with a meal.    [provider]  oxyCODONE-acetaminophen (ROXICET) 5-325 MG tablet Take 1 tablet by mouth every 6 (six) hours as needed. 01/24/16   Harvest Dark, MD  sertraline (ZOLOFT) 100 MG tablet Take 100 mg by mouth daily.    [provider]  tamsulosin (FLOMAX) 0.4 MG CAPS capsule Take 1 capsule (0.4 mg total) by mouth daily. 01/24/16   Harvest Dark, MD    Physical Exam: Vitals:   04/16/18 1721  BP: 125/72  Pulse: 87  Resp: 18  Temp: 98.3 F (36.8 C)  TempSrc: Oral  SpO2: 95%     General:  Appears calm and comfortable lying in bed in no acute distress Eyes:  PERRL, EOMI, normal lids, iris ENT:  grossly normal hearing, lips & tongue, mucous membranes of his mouth are pink dry Neck:  no LAD, masses or thyromegaly Cardiovascular:  RRR, no m/r/g. No LE edema.  Respiratory:  CTA bilaterally, no w/r/r. Normal respiratory effort. Abdomen:  soft, ntnd, positive bowel sounds throughout no guarding or rebounding Skin:  no rash or induration seen on limited exam Musculoskeletal:  grossly normal tone BUE/BLE, good ROM, no bony abnormality Psychiatric:  grossly normal mood and affect, speech fluent and appropriate, AOx3 Neurologic:  CN 2-12 grossly intact, moves all extremities in coordinated fashion, sensation intact. Speech clear facial symmetry bilateral grip 5 out of 5 lower extremity strength 5 out of 5  Labs on Admission: I have personally reviewed following labs and imaging studies  CBC: No results for input(s): WBC, NEUTROABS, HGB, HCT, MCV, PLT in the last 168 hours. Basic Metabolic Panel: No results for input(s): NA, K, CL, CO2, GLUCOSE, BUN, CREATININE, CALCIUM, MG, PHOS in the last 168 hours. GFR: CrCl cannot be calculated (Patient's most recent lab result is older than the maximum 21 days allowed.). Liver  Function Tests: No results for input(s): AST, ALT, ALKPHOS, BILITOT, PROT, ALBUMIN in the last 168 hours. No results for input(s): LIPASE, AMYLASE in the last 168 hours. No results for input(s): AMMONIA in the last 168 hours. Coagulation Profile: No results for input(s): INR, PROTIME in the last 168 hours. Cardiac Enzymes: No results for input(s): CKTOTAL, CKMB, CKMBINDEX, TROPONINI in the last 168 hours. BNP (last 3 results) No results for input(s): PROBNP in the last 8760 hours. HbA1C: No results for input(s): HGBA1C in the last 72 hours. CBG: No results for input(s): GLUCAP in the last 168 hours. Lipid Profile: No results for input(s): CHOL, HDL, LDLCALC, TRIG, CHOLHDL, LDLDIRECT in the last 72 hours. Thyroid Function Tests: No results for input(s): TSH, T4TOTAL, FREET4, T3FREE, THYROIDAB in the last  72 hours. Anemia Panel: No results for input(s): VITAMINB12, FOLATE, FERRITIN, TIBC, IRON, RETICCTPCT in the last 72 hours. Urine analysis:    Component Value Date/Time   COLORURINE YELLOW (A) 01/24/2016 1926   APPEARANCEUR Clear 02/02/2016 1437   LABSPEC 1.014 01/24/2016 1926   PHURINE 6.0 01/24/2016 1926   GLUCOSEU Negative 02/02/2016 1437   HGBUR 1+ (A) 01/24/2016 1926   BILIRUBINUR Negative 02/02/2016 1437   KETONESUR NEGATIVE 01/24/2016 1926   PROTEINUR Negative 02/02/2016 1437   PROTEINUR NEGATIVE 01/24/2016 1926   NITRITE Negative 02/02/2016 1437   NITRITE NEGATIVE 01/24/2016 1926   LEUKOCYTESUR Negative 02/02/2016 1437    Creatinine Clearance: CrCl cannot be calculated (Patient's most recent lab result is older than the maximum 21 days allowed.).  Sepsis Labs: @LABRCNTIP (procalcitonin:4,lacticidven:4) )No results found for this or any previous visit (from the past 240 hour(s)).   Radiological Exams on Admission: No results found.  EKG:   Assessment/Plan Principal Problem:   Occipital mass Active Problems:   Essential hypertension   Anxiety   #1.  Occipital mass. Per CT at [place] [facility] occipital mass with vasogenic edema and effacement on the lateral ventricle also with some hemorrhage. Mild dilation of the lateral horn. Case discussed with neurosurgery recommended admission for decadron and BP control -admit -decadron  Mg 3 times a day for now -Neuro checks -supportive therapy -appreciate neurosurgery  #2. Hypertension. Patient denies any history of high blood pressure. He is provided with clonidine and hydralazine at [place]. At the time of admission his blood pressures 125/72. Of note cbc and bmet within limits of normal at [place] with exception of platelet count 670 and glucose 101 -When necessary hydralazine with parameters -Low-dose Norvasc -Monitor -cbc and bmet   #3.anxiety. Stable at baseline   DVT prophylaxis: scd  Code Status: full  Family Communication:  None present Disposition Plan: home hopefully tomorrow  Consults called: jeffrey jenkins neurosurgery  Admission status: obs    Dyanne Carrel M MD [facility]  If 7PM-7AM, please contact night-coverage [email or web address] Password [facility]  04/16/2018, 5:52 PM

## 2018-04-16 NOTE — Consult Note (Signed)
Reason for Consult: Right brain tumor Referring Physician: Dr. Eleanora Neighbor Meikle is an 47 y.o. male.  HPI: The patient is a 47 year old white male who has noted headaches, vision loss, steady gait and some subtle behavioral changes.  He went to [facility] and was noted to be hypertensive.  A head CT was obtained which demonstrated a right occipital mass.  This was worked up further with a brain MRI which demonstrated an enhancing right occipital mass.  I spoke with Dr. Colin Rhein, the ER doctor.  I offered to have the patient follow-up with me in the office this week.  The patient was quite hypertensive and he was transferred to [facility] under the hospitalist service for further management of his hypertension.  A neurosurgical consultation has been requested.  Presently the patient is accompanied by multiple family members.  He relates the above information.  He denies nausea, vomiting, seizures, etc.  Past Medical History:  Diagnosis Date  . Anxiety   . Hypertension   . Kidney infection    when he was a child- hospitalized  . Kidney stone     Past Surgical History:  Procedure Laterality Date  . HAND SURGERY Left 2009    Family History  Problem Relation Age of Onset  . Nephrolithiasis Brother   . Bladder Cancer Neg Hx   . Kidney cancer Neg Hx   . Prostate cancer Neg Hx     Social History:  reports that he has quit smoking. His smoking use included cigarettes. He uses smokeless tobacco. He reports that he drinks alcohol. He reports that he does not use drugs.  Allergies:  Allergies  Allergen Reactions  . Penicillins Hives    Medications:  I have reviewed the patient's current medications. Prior to Admission:  Medications Prior to Admission  Medication Sig Dispense Refill Last Dose  . GARLIC 2440 PO Take by mouth.   12/20/2016 at Unknown time  . KRILL OIL PO Take by mouth.   12/20/2016 at Unknown time  . Multiple Vitamin (MULTIVITAMIN) capsule Take 1 capsule  by mouth daily.   12/20/2016 at Unknown time  . naproxen sodium (ANAPROX) 220 MG tablet Take 220 mg by mouth 2 (two) times daily with a meal.   Unknown at Unknown time  . oxyCODONE-acetaminophen (ROXICET) 5-325 MG tablet Take 1 tablet by mouth every 6 (six) hours as needed. 20 tablet 0 Unknown at Unknown time  . sertraline (ZOLOFT) 100 MG tablet Take 100 mg by mouth daily.   12/20/2016 at Unknown time  . tamsulosin (FLOMAX) 0.4 MG CAPS capsule Take 1 capsule (0.4 mg total) by mouth daily. 30 capsule 0 Unknown at Unknown time   Scheduled: . dexamethasone  4 mg Oral Q8H  . sertraline  100 mg Oral Daily  . tamsulosin  0.4 mg Oral Daily   Continuous:  NUU:[id number] **OR** acetaminophen, bisacodyl, hydrALAZINE, methocarbamol, ondansetron **OR** ondansetron (ZOFRAN) IV, oxyCODONE-acetaminophen, traZODone Anti-infectives (From admission, onward)   None       No results found for this or any previous visit (from the past 48 hour(s)).  No results found.  ROS: As above Blood pressure 125/72, pulse 87, temperature 98.3 F (36.8 C), temperature source Oral, resp. rate 18, SpO2 95 %. Estimated body mass index is 35.8 kg/m as calculated from the following:   Height as of 02/02/16: 6' (1.829 m).   Weight as of 02/02/16: 119.7 kg (264 lb).  Physical Exam  General: An obese and pleasant 47 year old white male in  no apparent distress  HEENT: Normocephalic, atraumatic, pupils equal round reactive light, extraocular muscles are intact.  Neck: Supple with a normal range of motion  Thorax: Symmetric  Abdomen: Obese and soft  Extremities: Unremarkable  Neurologic exam: The patient is alert and oriented x3, cranial nerves II through XII were examined bilaterally and grossly normal except the patient has a left homonymous hemianopsia.  He has some mild left hand apraxia.  His motor strength is grossly normal in his bilateral biceps, triceps, handgrip, gastrocnemius and dorsiflexors.  Cerebellar  function is intact to rapid alternating movements of the upper extremities bilaterally.  Imaging studies I reviewed the patient's head CT and brain MRI performed at [facility].  He has an enhancing right parietal occipital tumor with some mass-effect and midline shift.  There is some edema and midline shift.  I do not see any significant hemorrhage  Assessment/Plan: Right brain tumor: I have discussed the situation with the patient and his family.  We have discussed the various possibilities including stroke, infection, tumor, etc.  We have discussed the different types of tumors including metastatic versus primary brain tumors.  We have discussed the various treatment options including doing nothing, observation with serial MRI, stereotactic brain biopsy, and craniotomy for resection of the tumor.  I have recommended a craniotomy for tumor debulking and to make the diagnosis.  I have described as surgery to them.  We have discussed the risks of surgery including risk of anesthesia, hemorrhage, infection, seizures, stroke, incomplete tumor resection, recurrent tumor growth, medical risk, etc.  I have answered all their questions.  I have told him I cannot do the surgery until next week.  He is going to think things over and let me know his decision.  From my point of view he can be discharged to home on Decadron and return next week for his surgery.  We will order a repeat brain MRI with BrainLab protocol for neuronavigation.  Ophelia Charter 04/16/2018, 6:52 PM

## 2018-04-16 NOTE — Progress Notes (Signed)
Patient admitted from OSH with new findings of occipital mass.  Patient VSS upon arrival 145/89 HR 84, sat 96% on RA, RR 18.  MD called and made aware of new admission.  Patient very tearful, comforted patient and offered relaxation techniques.  Assisted patient to bathroom, steady on feet however patient has reported that he has been dizzy at times over the last month and admits to being forgetful and unable to find his way home.  PIV to Left AC flushed with goo blood return.  Patient oriented to room, placed on telemetry and left in bed with 2 siderails up.  Instructed patient to call for assistance.

## 2018-04-17 ENCOUNTER — Observation Stay (HOSPITAL_COMMUNITY): Payer: PRIVATE HEALTH INSURANCE

## 2018-04-17 ENCOUNTER — Other Ambulatory Visit: Payer: Self-pay | Admitting: Neurosurgery

## 2018-04-17 DIAGNOSIS — R220 Localized swelling, mass and lump, head: Secondary | ICD-10-CM

## 2018-04-17 DIAGNOSIS — D432 Neoplasm of uncertain behavior of brain, unspecified: Secondary | ICD-10-CM | POA: Diagnosis not present

## 2018-04-17 LAB — BASIC METABOLIC PANEL
ANION GAP: 9 (ref 5–15)
BUN: 13 mg/dL (ref 6–20)
CHLORIDE: 102 mmol/L (ref 101–111)
CO2: 26 mmol/L (ref 22–32)
Calcium: 9.7 mg/dL (ref 8.9–10.3)
Creatinine, Ser: 1.11 mg/dL (ref 0.61–1.24)
Glucose, Bld: 148 mg/dL — ABNORMAL HIGH (ref 65–99)
Potassium: 4 mmol/L (ref 3.5–5.1)
SODIUM: 137 mmol/L (ref 135–145)

## 2018-04-17 LAB — CBC
HCT: 46 % (ref 39.0–52.0)
HEMOGLOBIN: 16.3 g/dL (ref 13.0–17.0)
MCH: 31.7 pg (ref 26.0–34.0)
MCHC: 35.4 g/dL (ref 30.0–36.0)
MCV: 89.3 fL (ref 78.0–100.0)
Platelets: 735 10*3/uL — ABNORMAL HIGH (ref 150–400)
RBC: 5.15 MIL/uL (ref 4.22–5.81)
RDW: 13.4 % (ref 11.5–15.5)
WBC: 19 10*3/uL — ABNORMAL HIGH (ref 4.0–10.5)

## 2018-04-17 LAB — HIV ANTIBODY (ROUTINE TESTING W REFLEX): HIV Screen 4th Generation wRfx: NONREACTIVE

## 2018-04-17 MED ORDER — GADOBENATE DIMEGLUMINE 529 MG/ML IV SOLN
20.0000 mL | Freq: Once | INTRAVENOUS | Status: AC
  Administered 2018-04-17: 20 mL via INTRAVENOUS

## 2018-04-17 MED ORDER — ACETAMINOPHEN 325 MG PO TABS: 650.0000 mg | ORAL_TABLET | Freq: Four times a day (QID) | ORAL | 0 refills | Status: AC | PRN

## 2018-04-17 MED ORDER — OXYCODONE HCL 5 MG PO TABS: 5.0000 mg | ORAL_TABLET | Freq: Four times a day (QID) | ORAL | 0 refills | Status: DC | PRN

## 2018-04-17 MED ORDER — DEXAMETHASONE 4 MG PO TABS: 4.0000 mg | ORAL_TABLET | Freq: Three times a day (TID) | ORAL | 0 refills | Status: AC

## 2018-04-17 NOTE — Discharge Summary (Signed)
Physician Discharge Summary  Barry Rios WUJ:[id number] DOB: 1971-08-27 DOA: 04/16/2018  PCP: Tamsen Roers, MD  Admit date: 04/16/2018 Discharge date: 04/17/2018  Time spent: 35 minutes  Recommendations for Outpatient Follow-up:  1. Follow up with neurosurgery as outpatient, surgery scheduled on 5/13 2. Continue dexamethasone as outpatient.  Will need to follow up with neurosurgery for recommendations for eventual d/c and taper.   3. Follow up outpatient CBC/CMP    Discharge Diagnoses:  Principal Problem:   Occipital mass Active Problems:   Essential hypertension   Anxiety   Discharge Condition: stable  Diet recommendation: heart healthy  There were no vitals filed for this visit.  History of present illness:  Per HPI Barry Rios is a 47 y.o. male with medical history significant for he stones and anxiety presents to [facility] emergency Department chief complaint headache blurred vision dizzinessand nausea for 2-3 weeks. Initial evaluation includes CT of the head revealing occipital mass and hypertension. History discussed with neurosurgery who recommended Decadron blood pressure control and transfer [facility]. [facility] are asked to admit  Information is obtained from the patient. He states over the last 2-3 weeks he has experienced intermittent right ear pain headache located at the base of his scull constant pressure-like. Associated symptoms include blurred vision in his right eye on occasion dizziness and nausea. He denies any emesis. He denies any numbness tingling of extremities. He denies any syncope near-syncope denies any weakness. Also reports some intermittent confusion/memory loss providing an example that he forgot how to get home when he was driving his car from work. He denies chest pain palpitation shortness of breath. He denies dysuria hematuria frequency or urgency. He denies any diarrhea constipation melena or bright red blood per rectum. He denies any  fever chills recent travel or sick contacts unintentional weight loss.  Hospital Course:  1. Occipital mass. Per CT at [facility] occipital mass with vasogenic edema and effacement on the lateral ventricle also with some hemorrhage. Mild dilation of the lateral horn. Case discussed with neurosurgery recommended admission for decadron and BP control  - MRI here with large tumor within R temporal/occipital lobe with surrounding vasogenic edema.  6 mm leftward midline shift.  Mass effect on R optic radiation.  Discussed with Dr. Arnoldo Morale, planning for surgery on Monday.  [facility] for d/c on decadron. - F/u with surgery for plans for eventual taper and d/c of decadron  #2. Hypertension. Patient denies any history of high blood pressure. He is provided with clonidine and hydralazine at [place]. At the time of admission his blood pressures 125/72. Of note cbc and bmet within limits of normal at [facility] with exception of platelet count 670 and glucose 101.  BP was appropriate here.   - continue to monitor outpatient  #3.anxiety. Stable at baseline  # Leukocytosis: 2/2 steroids  # Thrombocytosis: follow  Procedures:  none   Consultations:  neurosurgery  Discharge Exam: Vitals:   04/17/18 0812 04/17/18 1149  BP: 131/71 112/65  Pulse: 90 92  Resp: 20 20  Temp: 98.2 F (36.8 C) 97.9 F (36.6 C)  SpO2: 96% 96%   Feeling slightly better, but still HA.  Thinks decadron has helped the most.  General: No acute distress. Cardiovascular: Heart sounds show a regular rate, and rhythm. No gallops or rubs. No murmurs. No JVD. Lungs: Clear to auscultation bilaterally with good air movement. No rales, rhonchi or wheezes. Abdomen: Soft, nontender, nondistended with normal active bowel sounds. No masses. No hepatosplenomegaly. Neurological: Alert and oriented  3. Moves all extremities 4 . Cranial nerves II through XII grossly intact. Skin: Warm and dry. No rashes or lesions. Extremities: No clubbing or  cyanosis. No edema. Pedal pulses 2+. Psychiatric: Mood and affect are normal. Insight and judgment are appropriate.   Discharge Instructions   Discharge Instructions    Call MD for:  difficulty breathing, headache or visual disturbances   Complete by:  As directed    Call MD for:  extreme fatigue   Complete by:  As directed    Call MD for:  persistant dizziness or light-headedness   Complete by:  As directed    Call MD for:  persistant nausea and vomiting   Complete by:  As directed    Call MD for:  redness, tenderness, or signs of infection (pain, swelling, redness, odor or green/yellow discharge around incision site)   Complete by:  As directed    Call MD for:  temperature >100.4   Complete by:  As directed    Diet - low sodium heart healthy   Complete by:  As directed    Discharge instructions   Complete by:  As directed    You were seen to have a brain tumor.    You were seen by neurosurgery who is planning for surgery sometime next week.  Please follow up with neurosurgery as scheduled.    Please continue the dexamethasone as prescribed.  Please follow up with neurosurgery for instructions on how long to continue this and how to discontinue this.   Return for new, recurrent, or worsening symptoms.  Please ask your PCP to request records from this hospitalization so they know what was done and what the next steps will be.   Increase activity slowly   Complete by:  As directed      Allergies as of 04/17/2018      Reactions   Penicillins Hives   Has patient had a PCN reaction causing immediate rash, facial/tongue/throat swelling, SOB or lightheadedness with hypotension: Yes Has patient had a PCN reaction causing severe rash involving mucus membranes or skin necrosis: No Has patient had a PCN reaction that required hospitalization: No Has patient had a PCN reaction occurring within the last 10 years: Yes If all of the above answers are "NO", then may proceed with  Cephalosporin use.      Medication List    STOP taking these medications   naproxen sodium 220 MG tablet Commonly known as:  ALEVE   oxyCODONE-acetaminophen 5-325 MG tablet Commonly known as:  ROXICET     TAKE these medications   acetaminophen 325 MG tablet Commonly known as:  TYLENOL Take 2 tablets (650 mg total) by mouth every 6 (six) hours as needed for mild pain or headache.   dexamethasone 4 MG tablet Commonly known as:  DECADRON Take 1 tablet (4 mg total) by mouth every 8 (eight) hours for 14 days. (please follow up with neurosurgery to determine how to taper this and when to stop)   GARLIC 7035 PO Take by mouth.   KRILL OIL PO Take by mouth.   multivitamin capsule Take 1 capsule by mouth daily.   oxyCODONE 5 MG immediate release tablet Commonly known as:  ROXICODONE Take 1 tablet (5 mg total) by mouth every 6 (six) hours as needed for up to 20 doses.   sertraline 100 MG tablet Commonly known as:  ZOLOFT Take 100 mg by mouth daily.   tamsulosin 0.4 MG Caps capsule Commonly known as:  FLOMAX Take  1 capsule (0.4 mg total) by mouth daily.      Allergies  Allergen Reactions  . Penicillins Hives    Has patient had a PCN reaction causing immediate rash, facial/tongue/throat swelling, SOB or lightheadedness with hypotension: Yes Has patient had a PCN reaction causing severe rash involving mucus membranes or skin necrosis: No Has patient had a PCN reaction that required hospitalization: No Has patient had a PCN reaction occurring within the last 10 years: Yes If all of the above answers are "NO", then may proceed with Cephalosporin use.    Follow-up Information    Newman Pies, MD Follow up.   Specialty:  Neurosurgery Why:  Call for follow up You're scheduled for surgery on Monday Contact information: [address] [place] [address] [phone]        Tamsen Roers, MD Follow up.   Specialty:  Family Medicine Contact  information: [address] [place] [address] [phone]            The results of significant diagnostics from this hospitalization (including imaging, microbiology, ancillary and laboratory) are listed below for reference.    Significant Diagnostic Studies: Mr Jeri Cos Wo Contrast  Result Date: 04/17/2018 CLINICAL DATA:  Headaches, hypertension and vision loss. Right occipital mass. EXAM: MRI HEAD WITHOUT AND WITH CONTRAST TECHNIQUE: Multiplanar, multiecho pulse sequences of the brain and surrounding structures were obtained without and with intravenous contrast. CONTRAST:  51mL MULTIHANCE GADOBENATE DIMEGLUMINE 529 MG/ML IV SOLN COMPARISON:  None. FINDINGS: BRAIN: The midline structures are normal. There is a large mass centered in the right temporal occipital junction with a large amount of surrounding vasogenic edema. The mass is heterogeneous with multiple internal foci of blood products. Contrast enhancement is primarily peripheral with a more solid component at the superior posterior aspect of the mass. The mass measures approximately 6.3 x 3.9 cm. The surrounding vasogenic edema extends into the right parietal lobe. There is mass effect on the lateral ventricles with effacement of the right occipital horn. There is 6 mm of leftward midline shift. No remote foci of contrast enhancement. Diffusion tensor imaging shows mass significant effect on the right optic radiation. VASCULAR: Major intracranial arterial and venous sinus flow voids are preserved. SKULL AND UPPER CERVICAL SPINE: The visualized skull base, calvarium, upper cervical spine and extracranial soft tissues are normal. SINUSES/ORBITS: No fluid levels or advanced mucosal thickening. No mastoid or middle ear effusion. Normal orbits. IMPRESSION: 1. Large tumor within the right temporal/occipital lobe with surrounding vasogenic edema. 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 2. 6 mm of leftward midline shift with mass  effect on the lateral ventricles. No herniation. 3. Marked mass effect on the right optic radiation visible on diffusion tensor imaging. Electronically Signed   By: Ulyses Jarred M.D.   On: 04/17/2018 13:36    Microbiology: No results found for this or any previous visit (from the past 240 hour(s)).   Labs: Basic Metabolic Panel: Recent Labs  Lab 04/17/18 0448  NA 137  K 4.0  CL 102  CO2 26  GLUCOSE 148*  BUN 13  CREATININE 1.11  CALCIUM 9.7   Liver Function Tests: No results for input(s): AST, ALT, ALKPHOS, BILITOT, PROT, ALBUMIN in the last 168 hours. No results for input(s): LIPASE, AMYLASE in the last 168 hours. No results for input(s): AMMONIA in the last 168 hours. CBC: Recent Labs  Lab 04/17/18 0448  WBC 19.0*  HGB 16.3  HCT 46.0  MCV  89.3  PLT 735*   Cardiac Enzymes: No results for input(s): CKTOTAL, CKMB, CKMBINDEX, TROPONINI in the last 168 hours. BNP: BNP (last 3 results) No results for input(s): BNP in the last 8760 hours.  ProBNP (last 3 results) No results for input(s): PROBNP in the last 8760 hours.  CBG: No results for input(s): GLUCAP in the last 168 hours.     Signed:  Fayrene Helper MD.  [facility] 04/17/2018, 6:55 PM

## 2018-04-17 NOTE — Progress Notes (Signed)
Subjective: The patient is alert and pleasant.  He is in no apparent distress.  His wife is at the bedside.  Objective: Vital signs in last 24 hours: Temp:  [98.2 F (36.8 C)-98.5 F (36.9 C)] 98.2 F (36.8 C) (05/09 0812) Pulse Rate:  [77-90] 90 (05/09 0812) Resp:  [15-20] 20 (05/09 0812) BP: (125-145)/(58-89) 131/71 (05/09 0812) SpO2:  [95 %-98 %] 96 % (05/09 0812) Estimated body mass index is 35.8 kg/m as calculated from the following:   Height as of 02/02/16: 6' (1.829 m).   Weight as of 02/02/16: 119.7 kg (264 lb).   Intake/Output from previous day: 05/08 0701 - 05/09 0700 In: 200 [P.O.:200] Out: -  Intake/Output this shift: Total I/O In: 120 [P.O.:120] Out: -   Physical exam the patient is alert and oriented.  His speech and strength is normal.  Lab Results: Recent Labs    04/17/18 0448  WBC 19.0*  HGB 16.3  HCT 46.0  PLT 735*   BMET Recent Labs    04/17/18 0448  NA 137  K 4.0  CL 102  CO2 26  GLUCOSE 148*  BUN 13  CREATININE 1.11  CALCIUM 9.7    Studies/Results: No results found.  Assessment/Plan: Right brain tumor: I have again discussed the situation with the patient.  I have answered all his questions regarding surgery.  We are shooting for Monday.  He can be discharged from my point of view after his stereotactic brain MRI and return on Monday for surgery.  I appreciate Dr. Florene Glen, et al, help.  Please discharge him on 4 mg of Decadron 3 times daily.  LOS: 0 days     Ophelia Charter 04/17/2018, 11:13 AM

## 2018-04-17 NOTE — Plan of Care (Signed)
Adequate for discharge.

## 2018-04-18 ENCOUNTER — Encounter (HOSPITAL_COMMUNITY): Payer: Self-pay

## 2018-04-21 ENCOUNTER — Inpatient Hospital Stay (HOSPITAL_COMMUNITY)
Admission: RE | Admit: 2018-04-21 | Discharge: 2018-04-23 | DRG: 026 | Disposition: A | Discharge status: Home / Self Care | Payer: PRIVATE HEALTH INSURANCE | Attending: Neurosurgery | Admitting: Neurosurgery

## 2018-04-21 ENCOUNTER — Inpatient Hospital Stay (HOSPITAL_COMMUNITY): Payer: PRIVATE HEALTH INSURANCE

## 2018-04-21 ENCOUNTER — Encounter (HOSPITAL_COMMUNITY): Payer: Self-pay | Admitting: *Deleted

## 2018-04-21 ENCOUNTER — Other Ambulatory Visit: Payer: Self-pay

## 2018-04-21 ENCOUNTER — Encounter (HOSPITAL_COMMUNITY)
Admission: RE | Disposition: A | Discharge status: Home / Self Care | Payer: Self-pay | Source: Home / Self Care | Attending: Neurosurgery

## 2018-04-21 DIAGNOSIS — Z79899 Other long term (current) drug therapy: Secondary | ICD-10-CM

## 2018-04-21 DIAGNOSIS — E669 Obesity, unspecified: Secondary | ICD-10-CM | POA: Diagnosis present

## 2018-04-21 DIAGNOSIS — D469 Myelodysplastic syndrome, unspecified: Secondary | ICD-10-CM | POA: Diagnosis present

## 2018-04-21 DIAGNOSIS — Z7952 Long term (current) use of systemic steroids: Secondary | ICD-10-CM | POA: Diagnosis not present

## 2018-04-21 DIAGNOSIS — M1711 Unilateral primary osteoarthritis, right knee: Secondary | ICD-10-CM | POA: Diagnosis present

## 2018-04-21 DIAGNOSIS — Z880 Allergy status to penicillin: Secondary | ICD-10-CM | POA: Diagnosis not present

## 2018-04-21 DIAGNOSIS — D72829 Elevated white blood cell count, unspecified: Secondary | ICD-10-CM | POA: Diagnosis not present

## 2018-04-21 DIAGNOSIS — Z87442 Personal history of urinary calculi: Secondary | ICD-10-CM | POA: Diagnosis not present

## 2018-04-21 DIAGNOSIS — F17220 Nicotine dependence, chewing tobacco, uncomplicated: Secondary | ICD-10-CM | POA: Diagnosis present

## 2018-04-21 DIAGNOSIS — I10 Essential (primary) hypertension: Secondary | ICD-10-CM | POA: Diagnosis present

## 2018-04-21 DIAGNOSIS — R51 Headache: Secondary | ICD-10-CM | POA: Diagnosis present

## 2018-04-21 DIAGNOSIS — F419 Anxiety disorder, unspecified: Secondary | ICD-10-CM | POA: Diagnosis present

## 2018-04-21 DIAGNOSIS — D496 Neoplasm of unspecified behavior of brain: Secondary | ICD-10-CM | POA: Diagnosis present

## 2018-04-21 DIAGNOSIS — K760 Fatty (change of) liver, not elsewhere classified: Secondary | ICD-10-CM | POA: Diagnosis present

## 2018-04-21 DIAGNOSIS — Z6834 Body mass index (BMI) 34.0-34.9, adult: Secondary | ICD-10-CM | POA: Diagnosis not present

## 2018-04-21 DIAGNOSIS — M19019 Primary osteoarthritis, unspecified shoulder: Secondary | ICD-10-CM | POA: Diagnosis present

## 2018-04-21 DIAGNOSIS — T380X5A Adverse effect of glucocorticoids and synthetic analogues, initial encounter: Secondary | ICD-10-CM | POA: Diagnosis not present

## 2018-04-21 DIAGNOSIS — C714 Malignant neoplasm of occipital lobe: Principal | ICD-10-CM | POA: Diagnosis present

## 2018-04-21 DIAGNOSIS — F329 Major depressive disorder, single episode, unspecified: Secondary | ICD-10-CM | POA: Diagnosis present

## 2018-04-21 DIAGNOSIS — E871 Hypo-osmolality and hyponatremia: Secondary | ICD-10-CM | POA: Diagnosis not present

## 2018-04-21 DIAGNOSIS — Z9889 Other specified postprocedural states: Secondary | ICD-10-CM

## 2018-04-21 HISTORY — DX: Fatty (change of) liver, not elsewhere classified: K76.0

## 2018-04-21 HISTORY — PX: APPLICATION OF CRANIAL NAVIGATION: SHX6578

## 2018-04-21 HISTORY — PX: CRANIOTOMY: SHX93

## 2018-04-21 HISTORY — DX: Neoplasm of unspecified behavior of brain: D49.6

## 2018-04-21 LAB — PREPARE RBC (CROSSMATCH)

## 2018-04-21 LAB — ABO/RH: ABO/RH(D): A POS

## 2018-04-21 LAB — MRSA PCR SCREENING: MRSA BY PCR: NEGATIVE

## 2018-04-21 SURGERY — CRANIOTOMY TUMOR EXCISION
Anesthesia: General | Site: Head | Laterality: Right

## 2018-04-21 MED ORDER — LACTATED RINGERS IV SOLN: INTRAVENOUS | Status: DC

## 2018-04-21 MED ORDER — MORPHINE SULFATE (PF) 4 MG/ML IV SOLN
1.0000 mg | INTRAVENOUS | Status: DC | PRN
  Administered 2018-04-21: 1 mg via INTRAVENOUS
  Administered 2018-04-22: 2 mg via INTRAVENOUS
  Filled 2018-04-21 (×2): qty 1

## 2018-04-21 MED ORDER — EPHEDRINE SULFATE 50 MG/ML IJ SOLN
INTRAMUSCULAR | Status: AC
  Filled 2018-04-21: qty 1

## 2018-04-21 MED ORDER — BACITRACIN ZINC 500 UNIT/GM EX OINT
TOPICAL_OINTMENT | CUTANEOUS | Status: AC
  Filled 2018-04-21: qty 28.35

## 2018-04-21 MED ORDER — ONDANSETRON HCL 4 MG/2ML IJ SOLN
INTRAMUSCULAR | Status: AC
  Filled 2018-04-21: qty 2

## 2018-04-21 MED ORDER — DEXAMETHASONE SODIUM PHOSPHATE 4 MG/ML IJ SOLN: 4.0000 mg | Freq: Four times a day (QID) | INTRAMUSCULAR | Status: DC

## 2018-04-21 MED ORDER — BACITRACIN 50000 UNITS IM SOLR
INTRAMUSCULAR | Status: DC | PRN
  Administered 2018-04-21: 16:00:00

## 2018-04-21 MED ORDER — LABETALOL HCL 5 MG/ML IV SOLN
INTRAVENOUS | Status: AC
  Filled 2018-04-21: qty 4

## 2018-04-21 MED ORDER — LABETALOL HCL 5 MG/ML IV SOLN
10.0000 mg | INTRAVENOUS | Status: DC | PRN
  Administered 2018-04-21: 10 mg via INTRAVENOUS

## 2018-04-21 MED ORDER — DEXAMETHASONE SODIUM PHOSPHATE 10 MG/ML IJ SOLN
INTRAMUSCULAR | Status: AC
  Filled 2018-04-21: qty 1

## 2018-04-21 MED ORDER — ONDANSETRON HCL 4 MG/2ML IJ SOLN
INTRAMUSCULAR | Status: DC | PRN
  Administered 2018-04-21: 4 mg via INTRAVENOUS

## 2018-04-21 MED ORDER — SUGAMMADEX SODIUM 200 MG/2ML IV SOLN
INTRAVENOUS | Status: DC | PRN
  Administered 2018-04-21: 300 mg via INTRAVENOUS

## 2018-04-21 MED ORDER — PHENYLEPHRINE HCL 10 MG/ML IJ SOLN
INTRAVENOUS | Status: DC | PRN
  Administered 2018-04-21: 20 ug/min via INTRAVENOUS

## 2018-04-21 MED ORDER — BUPIVACAINE-EPINEPHRINE (PF) 0.5% -1:200000 IJ SOLN
INTRAMUSCULAR | Status: AC
  Filled 2018-04-21: qty 30

## 2018-04-21 MED ORDER — HYDROCODONE-ACETAMINOPHEN 5-325 MG PO TABS
1.0000 | ORAL_TABLET | ORAL | Status: DC | PRN
  Administered 2018-04-21: 1 via ORAL
  Administered 2018-04-21 – 2018-04-22 (×2): 2 via ORAL
  Administered 2018-04-22 (×3): 1 via ORAL
  Administered 2018-04-22: 2 via ORAL
  Administered 2018-04-22 – 2018-04-23 (×3): 1 via ORAL
  Filled 2018-04-21 (×2): qty 2
  Filled 2018-04-21 (×4): qty 1
  Filled 2018-04-21: qty 2
  Filled 2018-04-21 (×3): qty 1

## 2018-04-21 MED ORDER — ROCURONIUM BROMIDE 50 MG/5ML IV SOLN
INTRAVENOUS | Status: AC
  Filled 2018-04-21: qty 1

## 2018-04-21 MED ORDER — SODIUM CHLORIDE 0.9 % IV SOLN
INTRAVENOUS | Status: DC | PRN
  Administered 2018-04-21: 13:00:00 via INTRAVENOUS

## 2018-04-21 MED ORDER — HEMOSTATIC AGENTS (NO CHARGE) OPTIME
TOPICAL | Status: DC | PRN
  Administered 2018-04-21 (×4): 1 via TOPICAL

## 2018-04-21 MED ORDER — HYDRALAZINE HCL 20 MG/ML IJ SOLN
INTRAMUSCULAR | Status: DC | PRN
  Administered 2018-04-21: 5 mg via INTRAVENOUS

## 2018-04-21 MED ORDER — PANTOPRAZOLE SODIUM 20 MG PO TBEC
20.0000 mg | DELAYED_RELEASE_TABLET | Freq: Two times a day (BID) | ORAL | Status: DC
  Administered 2018-04-21 – 2018-04-23 (×4): 20 mg via ORAL
  Filled 2018-04-21 (×4): qty 1

## 2018-04-21 MED ORDER — FENTANYL CITRATE (PF) 100 MCG/2ML IJ SOLN
INTRAMUSCULAR | Status: DC | PRN
  Administered 2018-04-21 (×2): 50 ug via INTRAVENOUS
  Administered 2018-04-21: 100 ug via INTRAVENOUS

## 2018-04-21 MED ORDER — SODIUM CHLORIDE 0.9 % IV SOLN
500.0000 mg | INTRAVENOUS | Status: AC
  Administered 2018-04-21: 500 mg via INTRAVENOUS
  Filled 2018-04-21: qty 5

## 2018-04-21 MED ORDER — DEXAMETHASONE SODIUM PHOSPHATE 10 MG/ML IJ SOLN
6.0000 mg | Freq: Four times a day (QID) | INTRAMUSCULAR | Status: AC
  Administered 2018-04-21 – 2018-04-22 (×4): 6 mg via INTRAVENOUS
  Filled 2018-04-21 (×4): qty 1

## 2018-04-21 MED ORDER — BUPIVACAINE-EPINEPHRINE 0.5% -1:200000 IJ SOLN
INTRAMUSCULAR | Status: DC | PRN
  Administered 2018-04-21: 10 mL

## 2018-04-21 MED ORDER — ROCURONIUM BROMIDE 10 MG/ML (PF) SYRINGE
PREFILLED_SYRINGE | INTRAVENOUS | Status: DC | PRN
  Administered 2018-04-21: 20 mg via INTRAVENOUS
  Administered 2018-04-21 (×2): 30 mg via INTRAVENOUS

## 2018-04-21 MED ORDER — SODIUM CHLORIDE 0.9 % IV SOLN
0.0500 ug/kg/min | INTRAVENOUS | Status: AC
  Administered 2018-04-21: .2 ug/kg/min via INTRAVENOUS
  Filled 2018-04-21: qty 5000

## 2018-04-21 MED ORDER — VANCOMYCIN HCL 1000 MG IV SOLR
INTRAVENOUS | Status: DC | PRN
  Administered 2018-04-21: 1000 mg via INTRAVENOUS

## 2018-04-21 MED ORDER — ACETAMINOPHEN 325 MG PO TABS
650.0000 mg | ORAL_TABLET | ORAL | Status: DC | PRN
  Administered 2018-04-22 – 2018-04-23 (×2): 650 mg via ORAL
  Filled 2018-04-21 (×2): qty 2

## 2018-04-21 MED ORDER — PROPOFOL 10 MG/ML IV BOLUS
INTRAVENOUS | Status: DC | PRN
  Administered 2018-04-21: 200 mg via INTRAVENOUS
  Administered 2018-04-21: 40 mg via INTRAVENOUS

## 2018-04-21 MED ORDER — ONDANSETRON HCL 4 MG/2ML IJ SOLN: 4.0000 mg | INTRAMUSCULAR | Status: DC | PRN

## 2018-04-21 MED ORDER — SURGIFOAM 100 EX MISC
CUTANEOUS | Status: DC | PRN
  Administered 2018-04-21: 17:00:00 via TOPICAL

## 2018-04-21 MED ORDER — ESMOLOL HCL 100 MG/10ML IV SOLN
INTRAVENOUS | Status: AC
  Filled 2018-04-21: qty 10

## 2018-04-21 MED ORDER — VANCOMYCIN HCL IN DEXTROSE 1-5 GM/200ML-% IV SOLN
INTRAVENOUS | Status: AC
  Filled 2018-04-21: qty 200

## 2018-04-21 MED ORDER — SODIUM CHLORIDE 0.9 % IV SOLN
INTRAVENOUS | Status: DC
  Administered 2018-04-21: 11:00:00 via INTRAVENOUS

## 2018-04-21 MED ORDER — MICROFIBRILLAR COLL HEMOSTAT EX POWD
CUTANEOUS | Status: AC
  Filled 2018-04-21: qty 5

## 2018-04-21 MED ORDER — SERTRALINE HCL 100 MG PO TABS
100.0000 mg | ORAL_TABLET | Freq: Every day | ORAL | Status: DC
  Administered 2018-04-21 – 2018-04-23 (×3): 100 mg via ORAL
  Filled 2018-04-21: qty 2
  Filled 2018-04-21 (×3): qty 1

## 2018-04-21 MED ORDER — PHENYLEPHRINE 40 MCG/ML (10ML) SYRINGE FOR IV PUSH (FOR BLOOD PRESSURE SUPPORT)
PREFILLED_SYRINGE | INTRAVENOUS | Status: AC
  Filled 2018-04-21: qty 10

## 2018-04-21 MED ORDER — PROPOFOL 10 MG/ML IV BOLUS
INTRAVENOUS | Status: AC
  Filled 2018-04-21: qty 20

## 2018-04-21 MED ORDER — VANCOMYCIN HCL IN DEXTROSE 1-5 GM/200ML-% IV SOLN
1000.0000 mg | Freq: Three times a day (TID) | INTRAVENOUS | Status: AC
  Administered 2018-04-21 – 2018-04-22 (×3): 1000 mg via INTRAVENOUS
  Filled 2018-04-21 (×3): qty 200

## 2018-04-21 MED ORDER — LIDOCAINE HCL (CARDIAC) PF 100 MG/5ML IV SOSY
PREFILLED_SYRINGE | INTRAVENOUS | Status: DC | PRN
  Administered 2018-04-21: 50 mg via INTRAVENOUS

## 2018-04-21 MED ORDER — SUGAMMADEX SODIUM 500 MG/5ML IV SOLN
INTRAVENOUS | Status: AC
  Filled 2018-04-21: qty 5

## 2018-04-21 MED ORDER — THROMBIN 20000 UNITS EX SOLR
CUTANEOUS | Status: AC
  Filled 2018-04-21: qty 20000

## 2018-04-21 MED ORDER — BACITRACIN ZINC 500 UNIT/GM EX OINT
TOPICAL_OINTMENT | CUTANEOUS | Status: DC | PRN
  Administered 2018-04-21: 1 via TOPICAL

## 2018-04-21 MED ORDER — ACETAMINOPHEN 650 MG RE SUPP: 650.0000 mg | RECTAL | Status: DC | PRN

## 2018-04-21 MED ORDER — PROMETHAZINE HCL 25 MG/ML IJ SOLN: 6.2500 mg | INTRAMUSCULAR | Status: DC | PRN

## 2018-04-21 MED ORDER — HYDROMORPHONE HCL 2 MG/ML IJ SOLN
0.3000 mg | INTRAMUSCULAR | Status: DC | PRN
  Administered 2018-04-21 (×3): 0.5 mg via INTRAVENOUS

## 2018-04-21 MED ORDER — DOCUSATE SODIUM 100 MG PO CAPS
100.0000 mg | ORAL_CAPSULE | Freq: Two times a day (BID) | ORAL | Status: DC
  Administered 2018-04-21 – 2018-04-23 (×4): 100 mg via ORAL
  Filled 2018-04-21 (×4): qty 1

## 2018-04-21 MED ORDER — MEPERIDINE HCL 50 MG/ML IJ SOLN: 6.2500 mg | INTRAMUSCULAR | Status: DC | PRN

## 2018-04-21 MED ORDER — EPHEDRINE SULFATE 50 MG/ML IJ SOLN
INTRAMUSCULAR | Status: DC | PRN
  Administered 2018-04-21: 5 mg via INTRAVENOUS
  Administered 2018-04-21: 10 mg via INTRAVENOUS
  Administered 2018-04-21 (×2): 5 mg via INTRAVENOUS

## 2018-04-21 MED ORDER — DEXAMETHASONE SODIUM PHOSPHATE 10 MG/ML IJ SOLN
INTRAMUSCULAR | Status: DC | PRN
  Administered 2018-04-21: 20 mg via INTRAVENOUS

## 2018-04-21 MED ORDER — HYDROMORPHONE HCL 2 MG/ML IJ SOLN
INTRAMUSCULAR | Status: AC
  Administered 2018-04-21: 0.5 mg via INTRAVENOUS
  Filled 2018-04-21: qty 1

## 2018-04-21 MED ORDER — LABETALOL HCL 5 MG/ML IV SOLN
INTRAVENOUS | Status: DC | PRN
  Administered 2018-04-21 (×2): 10 mg via INTRAVENOUS

## 2018-04-21 MED ORDER — ONDANSETRON HCL 4 MG PO TABS: 4.0000 mg | ORAL_TABLET | ORAL | Status: DC | PRN

## 2018-04-21 MED ORDER — 0.9 % SODIUM CHLORIDE (POUR BTL) OPTIME
TOPICAL | Status: DC | PRN
  Administered 2018-04-21 (×2): 1000 mL

## 2018-04-21 MED ORDER — POTASSIUM CHLORIDE IN NACL 20-0.9 MEQ/L-% IV SOLN
INTRAVENOUS | Status: DC
  Administered 2018-04-21: 19:00:00 via INTRAVENOUS
  Filled 2018-04-21 (×2): qty 1000

## 2018-04-21 MED ORDER — DEXAMETHASONE SODIUM PHOSPHATE 4 MG/ML IJ SOLN: 4.0000 mg | Freq: Three times a day (TID) | INTRAMUSCULAR | Status: DC

## 2018-04-21 MED ORDER — ESMOLOL HCL 100 MG/10ML IV SOLN
INTRAVENOUS | Status: DC | PRN
  Administered 2018-04-21: 20 ug via INTRAVENOUS
  Administered 2018-04-21: 30 ug via INTRAVENOUS

## 2018-04-21 MED ORDER — ARTIFICIAL TEARS OPHTHALMIC OINT
TOPICAL_OINTMENT | OPHTHALMIC | Status: DC | PRN
  Administered 2018-04-21: 1 via OPHTHALMIC

## 2018-04-21 MED ORDER — FENTANYL CITRATE (PF) 250 MCG/5ML IJ SOLN
INTRAMUSCULAR | Status: AC
  Filled 2018-04-21: qty 5

## 2018-04-21 MED ORDER — ARTIFICIAL TEARS OPHTHALMIC OINT
TOPICAL_OINTMENT | OPHTHALMIC | Status: AC
  Filled 2018-04-21: qty 3.5

## 2018-04-21 MED ORDER — MANNITOL 25 % IV SOLN
INTRAVENOUS | Status: DC | PRN
  Administered 2018-04-21: 25 g via INTRAVENOUS

## 2018-04-21 MED ORDER — ROCURONIUM BROMIDE 100 MG/10ML IV SOLN
INTRAVENOUS | Status: DC | PRN
  Administered 2018-04-21: 20 mg via INTRAVENOUS
  Administered 2018-04-21: 50 mg via INTRAVENOUS
  Administered 2018-04-21: 30 mg via INTRAVENOUS

## 2018-04-21 MED ORDER — LIDOCAINE 2% (20 MG/ML) 5 ML SYRINGE
INTRAMUSCULAR | Status: AC
  Filled 2018-04-21: qty 5

## 2018-04-21 MED ORDER — LEVETIRACETAM IN NACL 500 MG/100ML IV SOLN
500.0000 mg | Freq: Two times a day (BID) | INTRAVENOUS | Status: DC
  Administered 2018-04-21 – 2018-04-22 (×2): 500 mg via INTRAVENOUS
  Filled 2018-04-21 (×4): qty 100

## 2018-04-21 MED ORDER — PROMETHAZINE HCL 12.5 MG PO TABS
12.5000 mg | ORAL_TABLET | ORAL | Status: DC | PRN
  Filled 2018-04-21: qty 2

## 2018-04-21 SURGICAL SUPPLY — 94 items
BAG DECANTER FOR FLEXI CONT (MISCELLANEOUS) ×4 IMPLANT
BATTERY IQ STERILE (MISCELLANEOUS) ×4 IMPLANT
BIT DRILL WIRE PASS 1.3MM (BIT) IMPLANT
BLADE CLIPPER SPEC (BLADE) IMPLANT
BLADE ULTRA TIP 2M (BLADE) IMPLANT
BUR PRECISION FLUTE 6.0 (BURR) ×4 IMPLANT
BUR SPIRAL ROUTER 2.3 (BUR) IMPLANT
BUR SPIRAL ROUTER 2.3MM (BUR)
CANISTER SUCT 3000ML PPV (MISCELLANEOUS) ×8 IMPLANT
CARTRIDGE OIL MAESTRO DRILL (MISCELLANEOUS) ×4 IMPLANT
CATH VENTRIC 35X38 W/TROCAR LG (CATHETERS) IMPLANT
CLIP RANEY DISP (INSTRUMENTS) ×4 IMPLANT
CLIP VESOCCLUDE MED 6/CT (CLIP) IMPLANT
CONT SPEC 4OZ CLIKSEAL STRL BL (MISCELLANEOUS) ×4 IMPLANT
COVER BACK TABLE 60X90IN (DRAPES) IMPLANT
DIFFUSER DRILL AIR PNEUMATIC (MISCELLANEOUS) ×8 IMPLANT
DRAPE MICROSCOPE LEICA (MISCELLANEOUS) ×4 IMPLANT
DRAPE NEUROLOGICAL W/INCISE (DRAPES) ×4 IMPLANT
DRAPE SURG 17X23 STRL (DRAPES) IMPLANT
DRAPE WARM FLUID 44X44 (DRAPE) ×4 IMPLANT
DRILL WIRE PASS 1.3MM (BIT)
ELECT REM PT RETURN 9FT ADLT (ELECTROSURGICAL) ×4
ELECTRODE REM PT RTRN 9FT ADLT (ELECTROSURGICAL) ×2 IMPLANT
EVACUATOR 1/8 PVC DRAIN (DRAIN) IMPLANT
EVACUATOR SILICONE 100CC (DRAIN) IMPLANT
FLOSEAL 5ML (HEMOSTASIS) ×4 IMPLANT
GAUZE SPONGE 4X4 12PLY STRL (GAUZE/BANDAGES/DRESSINGS) ×4 IMPLANT
GAUZE SPONGE 4X4 16PLY XRAY LF (GAUZE/BANDAGES/DRESSINGS) IMPLANT
GLOVE BIO SURGEON STRL SZ8 (GLOVE) ×4 IMPLANT
GLOVE BIO SURGEON STRL SZ8.5 (GLOVE) ×4 IMPLANT
GLOVE BIOGEL PI IND STRL 7.0 (GLOVE) ×4 IMPLANT
GLOVE BIOGEL PI IND STRL 7.5 (GLOVE) ×4 IMPLANT
GLOVE BIOGEL PI INDICATOR 7.0 (GLOVE) ×4
GLOVE BIOGEL PI INDICATOR 7.5 (GLOVE) ×4
GLOVE ECLIPSE 9.0 STRL (GLOVE) ×4 IMPLANT
GLOVE EXAM NITRILE LRG STRL (GLOVE) IMPLANT
GLOVE EXAM NITRILE XL STR (GLOVE) IMPLANT
GLOVE EXAM NITRILE XS STR PU (GLOVE) IMPLANT
GOWN STRL REUS W/ TWL LRG LVL3 (GOWN DISPOSABLE) IMPLANT
GOWN STRL REUS W/ TWL XL LVL3 (GOWN DISPOSABLE) ×2 IMPLANT
GOWN STRL REUS W/TWL LRG LVL3 (GOWN DISPOSABLE)
GOWN STRL REUS W/TWL XL LVL3 (GOWN DISPOSABLE) ×2
HEMOSTAT SPONGE AVITENE ULTRA (HEMOSTASIS) ×4 IMPLANT
HEMOSTAT SURGICEL 2X14 (HEMOSTASIS) ×4 IMPLANT
KIT BASIN OR (CUSTOM PROCEDURE TRAY) ×4 IMPLANT
KIT CLIP RANEY GUN (KITS) IMPLANT
KIT DRAIN CSF ACCUDRAIN (MISCELLANEOUS) IMPLANT
KIT TURNOVER KIT B (KITS) ×4 IMPLANT
MARKER SKIN DUAL TIP RULER LAB (MISCELLANEOUS) IMPLANT
MARKER SPHERE PSV REFLC 13MM (MARKER) ×8 IMPLANT
NEEDLE HYPO 22GX1.5 SAFETY (NEEDLE) ×4 IMPLANT
NS IRRIG 1000ML POUR BTL (IV SOLUTION) ×4 IMPLANT
OIL CARTRIDGE MAESTRO DRILL (MISCELLANEOUS) ×8
PACK CRANIOTOMY CUSTOM (CUSTOM PROCEDURE TRAY) ×4 IMPLANT
PAD ARMBOARD 7.5X6 YLW CONV (MISCELLANEOUS) ×4 IMPLANT
PATTIES SURGICAL .25X.25 (GAUZE/BANDAGES/DRESSINGS) IMPLANT
PATTIES SURGICAL .5 X.5 (GAUZE/BANDAGES/DRESSINGS) IMPLANT
PATTIES SURGICAL .5 X3 (DISPOSABLE) IMPLANT
PATTIES SURGICAL 1X1 (DISPOSABLE) IMPLANT
PIN MAYFIELD SKULL DISP (PIN) IMPLANT
PLATE 1.5  2HOLE LNG NEURO (Plate) ×6 IMPLANT
PLATE 1.5 2HOLE LNG NEURO (Plate) ×6 IMPLANT
RUBBERBAND STERILE (MISCELLANEOUS) ×8 IMPLANT
SCREW SELF DRILL HT 1.5/4MM (Screw) ×24 IMPLANT
SET CARTRIDGE AND TUBING (SET/KITS/TRAYS/PACK) ×4 IMPLANT
SPECIMEN JAR SMALL (MISCELLANEOUS) IMPLANT
SPONGE NEURO XRAY DETECT 1X3 (DISPOSABLE) IMPLANT
SPONGE SURGIFOAM ABS GEL 100 (HEMOSTASIS) ×8 IMPLANT
STAPLER SKIN PROX WIDE 3.9 (STAPLE) ×4 IMPLANT
STOCKINETTE 6  STRL (DRAPES)
STOCKINETTE 6 STRL (DRAPES) IMPLANT
SUT ETHILON 3 0 FSL (SUTURE) IMPLANT
SUT ETHILON 3 0 PS 1 (SUTURE) IMPLANT
SUT NURALON 4 0 TR CR/8 (SUTURE) ×8 IMPLANT
SUT PROLENE 6 0 BV (SUTURE) IMPLANT
SUT SILK 0 TIES 10X30 (SUTURE) IMPLANT
SUT VIC AB 2-0 CP2 18 (SUTURE) ×4 IMPLANT
SUT VIC AB 3-0 FS2 27 (SUTURE) IMPLANT
SUT VICRYL 4-0 PS2 18IN ABS (SUTURE) IMPLANT
TAPE CLOTH SURG 4X10 WHT LF (GAUZE/BANDAGES/DRESSINGS) ×4 IMPLANT
TIP NONSTICK .5MMX23CM (INSTRUMENTS) ×2
TIP NONSTICK .5X23 (INSTRUMENTS) ×2 IMPLANT
TIP STANDARD 36KHZ (INSTRUMENTS) ×4
TIP STD 36KHZ (INSTRUMENTS) ×2 IMPLANT
TOWEL GREEN STERILE (TOWEL DISPOSABLE) ×4 IMPLANT
TOWEL GREEN STERILE FF (TOWEL DISPOSABLE) ×4 IMPLANT
TRAY FOLEY MTR SLVR 16FR STAT (SET/KITS/TRAYS/PACK) IMPLANT
TUBE CONNECTING 12'X1/4 (SUCTIONS)
TUBE CONNECTING 12X1/4 (SUCTIONS) IMPLANT
TUBE CONNECTING 20'X1/4 (TUBING) ×1
TUBE CONNECTING 20X1/4 (TUBING) ×3 IMPLANT
UNDERPAD 30X30 (UNDERPADS AND DIAPERS) IMPLANT
WATER STERILE IRR 1000ML POUR (IV SOLUTION) ×4 IMPLANT
WRENCH TORQUE 36KHZ (INSTRUMENTS) ×4 IMPLANT

## 2018-04-21 NOTE — Progress Notes (Signed)
Pharmacy Antibiotic Note  Barry Rios is a 47 y.o. male admitted on 04/21/2018 with craniotomy of right parieto-occipital brain tumor. The patient is allergic to PCN.  Received Vancomycin 1g IV x1 at 13:30 today preop.  Pharmacy has been consulted for vancomycin dosing for surgical prophylaxis x 24 hours.  SCr 1.11, CrCl ~ >/=100 ml/min,  Plan: Vancomycin 1000 mg IV q8h x 24 hours for surgical prophylaxis.  F/u SCr in AM and adjust dose as needed.    Height: 6' (182.9 cm) Weight: 255 lb (115.7 kg) IBW/kg (Calculated) : 77.6  Temp (24hrs), Avg:98.2 F (36.8 C), Min:97.7 F (36.5 C), Max:98.7 F (37.1 C)  Recent Labs  Lab 04/17/18 0448  WBC 19.0*  CREATININE 1.11    Estimated Creatinine Clearance: 106.8 mL/min (by C-G formula based on SCr of 1.11 mg/dL).    Allergies  Allergen Reactions  . Penicillins Hives    Has patient had a PCN reaction causing immediate rash, facial/tongue/throat swelling, SOB or lightheadedness with hypotension: Yes Has patient had a PCN reaction causing severe rash involving mucus membranes or skin necrosis: No Has patient had a PCN reaction that required hospitalization: No Has patient had a PCN reaction occurring within the last 10 years: Yes If all of the above answers are "NO", then may proceed with Cephalosporin use.    Thank you for allowing pharmacy to be a part of this patient's care. Nicole Cella, RPh Clinical Pharmacist Pager: [phone] [place] [phone] 04/21/2018 6:17 PM

## 2018-04-21 NOTE — Transfer of Care (Signed)
Immediate Anesthesia Transfer of Care Note  Patient: Barry Rios  Procedure(s) Performed: STEREOTACTIC RIGHT OCCIPITAL CRANIOTOMY TUMOR EXCISION (Right Head) APPLICATION OF CRANIAL NAVIGATION (Right )  Patient Location: PACU  Anesthesia Type:General  Level of Consciousness: awake, alert  and oriented  Airway & Oxygen Therapy: Patient Spontanous Breathing and Patient connected to nasal cannula oxygen  Post-op Assessment: Report given to RN, Post -op Vital signs reviewed and stable, Patient moving all extremities X 4 and Patient able to stick tongue midline  Post vital signs: Reviewed and stable  Last Vitals:  Vitals Value Taken Time  BP 112/60 04/21/2018  5:33 PM  Temp    Pulse 86 04/21/2018  5:45 PM  Resp 15 04/21/2018  5:45 PM  SpO2 93 % 04/21/2018  5:45 PM  Vitals shown include unvalidated device data.  Last Pain:  Vitals:   04/21/18 1730  TempSrc:   PainSc: 5       Patients Stated Pain Goal: 4 (30/05/11 0211)  Complications: No apparent anesthesia complications

## 2018-04-21 NOTE — Anesthesia Procedure Notes (Signed)
Procedure Name: Intubation Date/Time: 04/21/2018 1:38 PM Performed by: Lowella Dell, CRNA Pre-anesthesia Checklist: Patient identified, Emergency Drugs available, Suction available, Patient being monitored and Timeout performed Patient Re-evaluated:Patient Re-evaluated prior to induction Oxygen Delivery Method: Circle system utilized Preoxygenation: Pre-oxygenation with 100% oxygen Induction Type: IV induction Ventilation: Oral airway inserted - appropriate to patient size and Two handed mask ventilation required Laryngoscope Size: Mac and 4 Grade View: Grade I Tube type: Oral Tube size: 8.0 mm Number of attempts: 1 Airway Equipment and Method: Stylet Placement Confirmation: ETT inserted through vocal cords under direct vision,  positive ETCO2 and breath sounds checked- equal and bilateral Secured at: 23 cm Dental Injury: Teeth and Oropharynx as per pre-operative assessment  Comments: Satisfactory ventilation with 2 hand mask seal and OA. Performed by Garner Gavel SRNA under direct supervision

## 2018-04-21 NOTE — Progress Notes (Signed)
Subjective: The patient is alert and pleasant.  He looks great.  He has no complaints.  Objective: Vital signs in last 24 hours: Temp:  [97.7 F (36.5 C)-98.7 F (37.1 C)] 97.7 F (36.5 C) (05/13 1730) Pulse Rate:  [55-86] 82 (05/13 1747) Resp:  [15-20] 16 (05/13 1747) BP: (112-113)/(56-60) 113/56 (05/13 1747) SpO2:  [93 %-96 %] 96 % (05/13 1747) Arterial Line BP: (125-138)/(60-65) 138/65 (05/13 1747) Weight:  [115.7 kg (255 lb)] 115.7 kg (255 lb) (05/13 1029) Estimated body mass index is 34.58 kg/m as calculated from the following:   Height as of this encounter: 6' (1.829 m).   Weight as of this encounter: 115.7 kg (255 lb).   Intake/Output from previous day: No intake/output data recorded. Intake/Output this shift: Total I/O In: 1600 [I.V.:1600] Out: 2150 [Urine:1750; Blood:400]  Physical exam the patient is alert and pleasant.  He is moving all 4 extremities well.  Lab Results: No results for input(s): WBC, HGB, HCT, PLT in the last 72 hours. BMET No results for input(s): NA, K, CL, CO2, GLUCOSE, BUN, CREATININE, CALCIUM in the last 72 hours.  Studies/Results: No results found.  Assessment/Plan: Is post craniotomy for resection of tumor: The patient is doing well neurologically.  I have spoken with his family and answered all their questions.  We will plan to get a postoperative brain MRI tomorrow and await his pathology.  LOS: 0 days     Ophelia Charter 04/21/2018, 5:58 PM

## 2018-04-21 NOTE — Op Note (Signed)
Brief history: The patient is a 47 year old white male who was admitted last week with a right parieto-occipital brain tumor.  I discussed the various treatment options with the patient including surgery.  He has weighed the risks, benefits and alternatives of surgery and decided proceed with a craniotomy for resection of the tumor.  Preop diagnosis: Right parieto-occipital brain tumor  Postop diagnosis: The same, primary diagnosis is a malignant tumor  Procedure: Right parieto-occipital craniotomy for resection of tumor using microdissection; application of neuronavigation/[facility]rainLab  Surgeon: Dr. Earle Gell  Assistant: Dr. Annette Stable  Anesthesia: General tracheal  Estimated blood loss: 225 cc  Specimens: Tumor  Drains: None  Complications: None  Description of procedure: The patient was brought to the operating room by the anesthesia team.  His MRI had previously been loaded into the BrainLab neuro navigation station.  General endotracheal anesthesia was induced.  I then applied the Mayfield three-point headrest to the patient's calvarium.  The patient was placed in the semi-prone position with his left side down.  His head was turned towards the floor exposing his right parieto-occipital scalp.  We then entered the surface points to the BrainLab neuro navigation system.  The patient's parieto-occipital scalp was then shaved with clippers and prepared with Betadine scrub and Betadine solution.  Sterile drapes were applied.  I injected the area to be incised with Marcaine with epinephrine solution.  With the aid of the neuronavigation unit we made a linear incision over the patient's right parietal occipital tumor.  We used Raney clips for wound edge hemostasis.  I used electrocautery to incise the patient's fascia and occipital musculature.  Underlying calvarium with the periosteal elevators.  We used the Aphelbaum retractor for exposure.  I then used a high-speed drill to create a  parieto-occipital bur hole.  I then used a footplate device to create a craniotomy.  We elevated the craniotomy flap with the Penfield #1.  The underlying dura was somewhat tense.  Anesthesia gave mannitol and hyperventilated the patient.  I then incised the dura with a 15 blade scalpel and the scissors in a cruciate fashion.  We tacked up the dural edges.  I then confirmed the area for the corticotomy using the BrainLab neuronavigational wand.  I use bipolar cautery and suction to create a small corticotomy.  We dissected deeper with suction and bipolar electrocautery.  We encountered the tumor at approximately 2 cm deep.  It was release of tan thin fluid.  We entered into the center of the tumor.  We obtained several biopsy specimens which was sent to the pathologist for frozen section.  It was consistent with a malignant tumor but they could not tell us what kind.  We brought the operative microscope into the field and under his medication and illumination we completed the micro dissection.  The tumor was necrotic and some areas.  We coagulated small intratumor vessels with electrocautery and divided with microscissors.  I then used the Cusa to debulk the obvious tumor tissue back to more normal tissue circumferentially around the tumor.  The best I could tell we got a gross total resection.  We then obtained hemostasis using bipolar electrocautery, Gelfoam, Surgiflo and Avitene flour.  I irrigated the substances out of the tumor resection cavity.  We obtained good hemostasis.  I lined the tumor resection cavity with Surgicel.  We then reapproximated the patient's dura with interrupted 4-0 Nurolon suture.  We laid a large piece of Gelfoam over the dura exposed by the craniotomy.  We then replaced the craniotomy flap with titanium mini plates and screws.  We then removed the retractor and reapproximated the patient's muscle and fascia with interrupted 2-0 Vicryl suture.  We reapproximate the galea with  interrupted 2-0 Vicryl suture.  We reapproximated skin with stainless steel staples.  The wound was then coated with bacitracin ointment.  A sterile dressing was applied.  The drapes were removed.  I then removed the Mayfield three-point head rest from the patient's calvarium.  He was then returned to the supine position.  By report all sponge, instrument, and needle counts were correct at the end of this case.

## 2018-04-21 NOTE — Anesthesia Postprocedure Evaluation (Signed)
Anesthesia Post Note  Patient: Dsean Vantol  Procedure(s) Performed: STEREOTACTIC RIGHT OCCIPITAL CRANIOTOMY TUMOR EXCISION (Right Head) APPLICATION OF CRANIAL NAVIGATION (Right )     Patient location during evaluation: PACU Anesthesia Type: General Level of consciousness: awake and alert Pain management: pain level controlled Vital Signs Assessment: post-procedure vital signs reviewed and stable Respiratory status: spontaneous breathing, nonlabored ventilation, respiratory function stable and patient connected to nasal cannula oxygen Cardiovascular status: blood pressure returned to baseline and stable Postop Assessment: no apparent nausea or vomiting Anesthetic complications: no    Last Vitals:  Vitals:   04/21/18 1930 04/21/18 2000  BP: 90/65 (!) 109/58  Pulse: 72 71  Resp: 19 (!) 22  Temp:  36.7 C  SpO2: 94% 93%    Last Pain:  Vitals:   04/21/18 2035  TempSrc:   PainSc: Asleep                 Kenisha Lynds P Jakeel Starliper

## 2018-04-21 NOTE — Anesthesia Procedure Notes (Signed)
Arterial Line Insertion Start/End5/13/2019 12:45 PM, 04/21/2018 12:55 PM Performed by: Effie Berkshire, MD  Patient location: Pre-op. Preanesthetic checklist: patient identified, IV checked, risks and benefits discussed, surgical consent, monitors and equipment checked, pre-op evaluation, timeout performed and anesthesia consent Lidocaine 1% used for infiltration Left, radial was placed Catheter size: 20 G Hand hygiene performed , maximum sterile barriers used  and Seldinger technique used Allen's test indicative of satisfactory collateral circulation Attempts: 1 Procedure performed without using ultrasound guided technique. Following insertion, dressing applied and Biopatch. Post procedure assessment: normal  Patient tolerated the procedure well with no immediate complications. Additional procedure comments: Performed by Garner Gavel SRNA under direct supervision.

## 2018-04-21 NOTE — Anesthesia Preprocedure Evaluation (Addendum)
Anesthesia Evaluation  Patient identified by MRN, date of birth, ID band Patient awake    Reviewed: Allergy & Precautions, NPO status , Patient's Chart, lab work & pertinent test results  Airway Mallampati: II  TM Distance: <3 FB Neck ROM: Full    Dental  (+) Teeth Intact, Dental Advisory Given   Pulmonary former smoker,    breath sounds clear to auscultation       Cardiovascular hypertension,  Rhythm:Regular Rate:Normal     Neuro/Psych  Headaches, PSYCHIATRIC DISORDERS Anxiety Depression    GI/Hepatic negative GI ROS, Neg liver ROS,   Endo/Other  negative endocrine ROS  Renal/GU      Musculoskeletal  (+) Arthritis ,   Abdominal Normal abdominal exam  (+)   Peds  Hematology negative hematology ROS (+)   Anesthesia Other Findings   Reproductive/Obstetrics negative OB ROS                            Anesthesia Physical Anesthesia Plan  ASA: II  Anesthesia Plan: General   Post-op Pain Management:    Induction: Intravenous  PONV Risk Score and Plan: Ondansetron, Dexamethasone and Midazolam  Airway Management Planned: Oral ETT  Additional Equipment: Arterial line  Intra-op Plan:   Post-operative Plan: Extubation in OR  Informed Consent: I have reviewed the patients History and Physical, chart, labs and discussed the procedure including the risks, benefits and alternatives for the proposed anesthesia with the patient or authorized representative who has indicated his/her understanding and acceptance.   Dental advisory given  Plan Discussed with: CRNA  Anesthesia Plan Comments:        Anesthesia Quick Evaluation

## 2018-04-21 NOTE — H&P (Signed)
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  He was started on Keppra and Decadron.  Arrangements were made for a craniotomy today.  Past Medical History:  Diagnosis Date  . Anxiety   . Arthritis    "right knee and shoulder" (04/16/2018)  . Depression   . Fatty liver   . Headache    "weekly" (04/16/2018)  . History of kidney stones   . Hypertension   . Kidney infection    when he was a child- hospitalized  . Occipital mass 04/2018    Past Surgical History:  Procedure Laterality Date  . HAND SURGERY Left 2009   "reattached nerves and veins"    Allergies  Allergen Reactions  . Penicillins Hives    Has patient had a PCN reaction causing immediate rash, facial/tongue/throat swelling, SOB or lightheadedness with hypotension: Yes Has patient had a PCN reaction causing severe rash involving mucus membranes or skin necrosis: No Has patient had a PCN reaction that required hospitalization: No Has patient had a PCN reaction occurring within the last 10 years: Yes If all of the above answers are "NO", then may proceed with Cephalosporin use.     Social History   Tobacco Use  . Smoking status: Former Smoker    Packs/day: 0.50    Years: 20.00    Pack years: 10.00    Types: Cigarettes    Last attempt to quit: 2007    Years since quitting: 12.3  . Smokeless tobacco: Current User    Types: Chew  . Tobacco comment: 04/16/2018 1 pouch/day nicotine   Substance Use Topics  . Alcohol use: Yes    Comment: 04/16/2018 "might have 1 beer/month"    Family History  Problem Relation Age of Onset  . Nephrolithiasis Brother   . Bladder Cancer Neg Hx   . Kidney cancer Neg Hx   . Prostate cancer Neg Hx    Prior to Admission medications   Medication Sig Start Date End Date Taking? Authorizing Provider  acetaminophen (TYLENOL) 325 MG tablet Take 2 tablets (650 mg total) by mouth every 6 (six) hours as needed for mild pain or headache.  04/17/18 05/17/18 Yes Elodia Florence., MD  dexamethasone (DECADRON) 4 MG tablet Take 1 tablet (4 mg total) by mouth every 8 (eight) hours for 14 days. (please follow up with neurosurgery to determine how to taper this and when to stop) 04/17/18 05/01/18 Yes Elodia Florence., MD  GARLIC 0932 PO Take by mouth.   Yes [provider]  KRILL OIL PO Take by mouth.   Yes [provider]  Multiple Vitamin (MULTIVITAMIN) capsule Take 1 capsule by mouth daily.   Yes [provider]  oxyCODONE (ROXICODONE) 5 MG immediate release tablet Take 1 tablet (5 mg total) by mouth every 6 (six) hours as needed for up to 20 doses. 04/17/18  Yes Elodia Florence., MD  sertraline (ZOLOFT) 100 MG tablet Take 100 mg by mouth daily.   Yes [provider]  tamsulosin (FLOMAX) 0.4 MG CAPS capsule Take 1 capsule (0.4 mg total) by mouth daily. Patient not taking: Reported on 04/16/2018 01/24/16   Harvest Dark, MD     Review of Systems  Positive ROS: As above  All other systems have been reviewed and were otherwise negative with the exception of those mentioned in the HPI and as above.  Objective: Vital signs in last 24 hours: Temp:  [98.7  F (37.1 C)] (P) 98.7 F (37.1 C) (05/13 1034) Pulse Rate:  [55] (P) 55 (05/13 1034) Resp:  [20] (P) 20 (05/13 1034) SpO2:  [95 %] (P) 95 % (05/13 1034) Weight:  [115.7 kg (255 lb)] 115.7 kg (255 lb) (05/13 1029) Estimated body mass index is 34.58 kg/m as calculated from the following:   Height as of this encounter: 6' (1.829 m).   Weight as of this encounter: 115.7 kg (255 lb).   Exam:  General: An obese and pleasant 47 year old white male no apparent distress  Neurologic exam: The patient is alert and oriented x3.  He has mild left hemiparasis.  His speech is normal.  His pupils are equal.   Data Review Lab Results  Component Value Date   WBC 19.0 (H) 04/17/2018   HGB 16.3 04/17/2018   HCT 46.0 04/17/2018   MCV 89.3  04/17/2018   PLT 735 (H) 04/17/2018   Lab Results  Component Value Date   NA 137 04/17/2018   K 4.0 04/17/2018   CL 102 04/17/2018   CO2 26 04/17/2018   BUN 13 04/17/2018   CREATININE 1.11 04/17/2018   GLUCOSE 148 (H) 04/17/2018   No results found for: INR, PROTIME  Assessment/Plan: Right brain tumor: I have discussed the situation with the patient.  We have discussed the various treatment options including surgery.  I have described the surgical treatment option of a right craniotomy for debulking of this tumor.  I have described that surgery to him.  We have discussed the risks, benefits, alternatives, expected postoperative course, and likelihood of achieving our goals with surgery.  I have answered all the patient's, and his family's, questions.  He has decided to proceed with surgery.   Ophelia Charter 04/21/2018 1:21 PM

## 2018-04-22 ENCOUNTER — Inpatient Hospital Stay (HOSPITAL_COMMUNITY): Payer: PRIVATE HEALTH INSURANCE

## 2018-04-22 ENCOUNTER — Encounter (HOSPITAL_COMMUNITY): Payer: Self-pay | Admitting: Neurosurgery

## 2018-04-22 LAB — BASIC METABOLIC PANEL
ANION GAP: 8 (ref 5–15)
BUN: 21 mg/dL — AB (ref 6–20)
CALCIUM: 8.5 mg/dL — AB (ref 8.9–10.3)
CO2: 22 mmol/L (ref 22–32)
CREATININE: 0.98 mg/dL (ref 0.61–1.24)
Chloride: 104 mmol/L (ref 101–111)
GFR calc Af Amer: >60 mL/min (ref >60)
GLUCOSE: 139 mg/dL — AB (ref 65–99)
Potassium: 4.2 mmol/L (ref 3.5–5.1)
Sodium: 134 mmol/L — ABNORMAL LOW (ref 135–145)

## 2018-04-22 LAB — CBC
HCT: 42.2 % (ref 39.0–52.0)
HEMOGLOBIN: 14.5 g/dL (ref 13.0–17.0)
MCH: 31.2 pg (ref 26.0–34.0)
MCHC: 34.4 g/dL (ref 30.0–36.0)
MCV: 90.8 fL (ref 78.0–100.0)
Platelets: 782 10*3/uL — ABNORMAL HIGH (ref 150–400)
RBC: 4.65 MIL/uL (ref 4.22–5.81)
RDW: 13.7 % (ref 11.5–15.5)
WBC: 26.9 10*3/uL — ABNORMAL HIGH (ref 4.0–10.5)

## 2018-04-22 MED ORDER — DEXAMETHASONE 4 MG PO TABS
4.0000 mg | ORAL_TABLET | Freq: Four times a day (QID) | ORAL | Status: AC
  Administered 2018-04-22 – 2018-04-23 (×4): 4 mg via ORAL
  Filled 2018-04-22 (×4): qty 1

## 2018-04-22 MED ORDER — NICOTINE 21 MG/24HR TD PT24
21.0000 mg | MEDICATED_PATCH | Freq: Every day | TRANSDERMAL | Status: DC
  Administered 2018-04-22 – 2018-04-23 (×2): 21 mg via TRANSDERMAL
  Filled 2018-04-22 (×2): qty 1

## 2018-04-22 MED ORDER — GADOBENATE DIMEGLUMINE 529 MG/ML IV SOLN
20.0000 mL | Freq: Once | INTRAVENOUS | Status: AC
  Administered 2018-04-22: 20 mL via INTRAVENOUS

## 2018-04-22 MED ORDER — LEVETIRACETAM 500 MG PO TABS
500.0000 mg | ORAL_TABLET | Freq: Two times a day (BID) | ORAL | Status: DC
  Administered 2018-04-22 – 2018-04-23 (×2): 500 mg via ORAL
  Filled 2018-04-22 (×2): qty 1

## 2018-04-22 MED ORDER — DEXAMETHASONE 4 MG PO TABS: 4.0000 mg | ORAL_TABLET | Freq: Three times a day (TID) | ORAL | Status: DC

## 2018-04-22 MED FILL — Thrombin For Soln 20000 Unit: CUTANEOUS | Qty: 1 | Status: AC

## 2018-04-22 NOTE — Progress Notes (Signed)
Pt's PIVs blew. RN and IV team attempted three times and were unable to start IV. MD notified. He authorized changing meds (decadron and keppra) to PO. IV fluids d/c'd.

## 2018-04-22 NOTE — Progress Notes (Signed)
Subjective: The patient is alert and pleasant.  He looks well.  He has no complaints.  His wife and daughter at the bedside.  Objective: Vital signs in last 24 hours: Temp:  [97.7 F (36.5 C)-98.7 F (37.1 C)] 98.1 F (36.7 C) (05/14 0400) Pulse Rate:  [55-86] 62 (05/14 0600) Resp:  [13-22] 19 (05/14 0600) BP: (89-127)/(45-76) 126/66 (05/14 0600) SpO2:  [91 %-96 %] 96 % (05/14 0600) Arterial Line BP: (70-163)/(52-72) 163/67 (05/14 0600) Weight:  [111.6 kg (246 lb 0.5 oz)-115.7 kg (255 lb)] 111.6 kg (246 lb 0.5 oz) (05/13 1828) Estimated body mass index is 33.37 kg/m as calculated from the following:   Height as of this encounter: 6' (1.829 m).   Weight as of this encounter: 111.6 kg (246 lb 0.5 oz).   Intake/Output from previous day: 05/13 0701 - 05/14 0700 In: 2779.8 [P.O.:225; I.V.:2554.8] Out: 3150 [Urine:2750; Blood:400] Intake/Output this shift: No intake/output data recorded.  Physical exam the patient is alert and oriented x3.  Speech and strength is normal.  He says his vision is "better".  The patient's dressing is clean and dry.  Lab Results: Recent Labs    04/22/18 0530  WBC 26.9*  HGB 14.5  HCT 42.2  PLT 782*   BMET Recent Labs    04/22/18 0530  NA 134*  K 4.2  CL 104  CO2 22  GLUCOSE 139*  BUN 21*  CREATININE 0.98  CALCIUM 8.5*    Studies/Results: No results found.  Assessment/Plan: Postop day #1: The patient is doing well neurologically.  We will get a baseline MRI scan and await pathology.  We will continue his Decadron taper and Keppra.  Leukocytosis: Likely secondary to steroids  Mild hyponatremia: Noted.  LOS: 1 day     Ophelia Charter 04/22/2018, 7:43 AM

## 2018-04-23 LAB — BPAM RBC
BLOOD PRODUCT EXPIRATION DATE: 201906012359
Blood Product Expiration Date: 201905282359
ISSUE DATE / TIME: 201905131320
ISSUE DATE / TIME: 201905131320
UNIT TYPE AND RH: 6200
Unit Type and Rh: 6200

## 2018-04-23 LAB — TYPE AND SCREEN
ABO/RH(D): A POS
ANTIBODY SCREEN: NEGATIVE
UNIT DIVISION: 0
Unit division: 0

## 2018-04-23 MED ORDER — LEVETIRACETAM 500 MG PO TABS: 500.0000 mg | ORAL_TABLET | Freq: Two times a day (BID) | ORAL | 1 refills | Status: DC

## 2018-04-23 MED ORDER — HYDROCODONE-ACETAMINOPHEN 5-325 MG PO TABS: 1.0000 | ORAL_TABLET | ORAL | 0 refills | Status: DC | PRN

## 2018-04-23 NOTE — Progress Notes (Signed)
Patient transferred to 4NP12 with belongings. RN at patient bedside.

## 2018-04-23 NOTE — Discharge Summary (Signed)
Physician Discharge Summary  Patient ID: Barry Rios MRN: [id number] DOB/AGE: May 02, 1971 47 y.o.  Admit date: 04/21/2018 Discharge date: 04/23/2018  Admission Diagnoses: Right parieto-occipital brain tumor  Discharge Diagnoses:   Active Problems:   S/P craniotomy   Brain tumor [facility]) The same  Discharged Condition: good  Hospital Course: I performed a right parieto-occipital craniotomy and application of neuronavigation and microdissection on patient on 04/21/2018 for gross total resection of his right parietal occipital brain tumor.  The patient's postoperative course was unremarkable.  A postoperative MRI look good with no visible residual tumor.  On 04/23/2018 the patient requested discharge to home.  At the time of his discharge the final pathology was pending.  The patient, and his family, were given written and oral discharge instructions.  He was instructed to follow-up with me next week for staple removal and discussion of his final pathology.  I answered all their questions.  Consults: None Significant Diagnostic Studies: Brain MRI Treatments: Right parietal occipital craniotomy using microdissection, application of neuronavigation Discharge Exam: Blood pressure 121/67, pulse (!) 55, temperature 97.6 F (36.4 C), temperature source Oral, resp. rate 20, height 6' (1.829 m), weight 111.6 kg (246 lb 0.5 oz), SpO2 94 %. The patient is alert and oriented.  His speech and strength is normal.  He looks well.  His dressing is clean and dry.  Disposition: Home  Discharge Instructions    Call MD for:  difficulty breathing, headache or visual disturbances   Complete by:  As directed    Call MD for:  extreme fatigue   Complete by:  As directed    Call MD for:  hives   Complete by:  As directed    Call MD for:  persistant dizziness or light-headedness   Complete by:  As directed    Call MD for:  persistant nausea and vomiting   Complete by:  As directed    Call MD for:  redness,  tenderness, or signs of infection (pain, swelling, redness, odor or green/yellow discharge around incision site)   Complete by:  As directed    Call MD for:  severe uncontrolled pain   Complete by:  As directed    Call MD for:  temperature >100.4   Complete by:  As directed    Diet - low sodium heart healthy   Complete by:  As directed    Discharge instructions   Complete by:  As directed    Call [phone] for a followup appointment. Take a stool softener while you are using pain medications.   Driving Restrictions   Complete by:  As directed    Do not drive for 2 weeks.   Increase activity slowly   Complete by:  As directed    Lifting restrictions   Complete by:  As directed    Do not lift more than 5 pounds. No excessive bending or twisting.   May shower / Bathe   Complete by:  As directed    Remove the dressing for 3 days after surgery.  You may shower, but leave the incision alone.   Remove dressing in 24 hours   Complete by:  As directed      Allergies as of 04/23/2018      Reactions   Penicillins Hives   Has patient had a PCN reaction causing immediate rash, facial/tongue/throat swelling, SOB or lightheadedness with hypotension: Yes Has patient had a PCN reaction causing severe rash involving mucus membranes or skin necrosis: No Has patient had a  PCN reaction that required hospitalization: No Has patient had a PCN reaction occurring within the last 10 years: Yes If all of the above answers are "NO", then may proceed with Cephalosporin use.      Medication List    STOP taking these medications   GARLIC 6599 PO   KRILL OIL PO   oxyCODONE 5 MG immediate release tablet Commonly known as:  ROXICODONE     TAKE these medications   acetaminophen 325 MG tablet Commonly known as:  TYLENOL Take 2 tablets (650 mg total) by mouth every 6 (six) hours as needed for mild pain or headache.   dexamethasone 4 MG tablet Commonly known as:  DECADRON Take 1 tablet (4 mg total)  by mouth every 8 (eight) hours for 14 days. (please follow up with neurosurgery to determine how to taper this and when to stop)   HYDROcodone-acetaminophen 5-325 MG tablet Commonly known as:  NORCO/VICODIN Take 1-2 tablets by mouth every 4 (four) hours as needed for moderate pain.   levETIRAcetam 500 MG tablet Commonly known as:  KEPPRA Take 1 tablet (500 mg total) by mouth 2 (two) times daily.   multivitamin capsule Take 1 capsule by mouth daily.   sertraline 100 MG tablet Commonly known as:  ZOLOFT Take 100 mg by mouth daily.   tamsulosin 0.4 MG Caps capsule Commonly known as:  FLOMAX Take 1 capsule (0.4 mg total) by mouth daily.        Signed: Ophelia Charter 04/23/2018, 2:00 PM

## 2018-05-01 ENCOUNTER — Other Ambulatory Visit: Payer: Self-pay | Admitting: Neurosurgery

## 2018-05-07 NOTE — Progress Notes (Signed)
Location/Histology of Brain Tumor: Glioblastoma  Patient presented to [facility] noted to be hypertensive.  He also was noted with headache, blurred vision right eye, dizziness, right ear pain and nausea for 2-3 weeks.  He also complained of intermittent confusion and memory loss.    CT head ([facility]): occipital mass with vasogenic edema and effacement on the lateral ventricle also with some hemorrhage.  Mild dilation of the lateral horn.    MRI Brain: enhancing right occipital mass.  MRI Brain 04/22/2018 post-op: 1. Postoperative changes from interval right parieto-occipital craniotomy for brain tumor resection as above. Gross total resection with no residual tumor identified. 2. Residual regional mass effect and edema with 7 mm of right-to-left shift.  04/21/2018  Past or anticipated interventions, if any, per neurosurgery:  Right parieto-occipital Craniotomy 04/21/2018 with Dr. Arnoldo Morale  Past or anticipated interventions, if any, per medical oncology:  Dr. Mickeal Skinner 05/08/2018 Discussed the treatment plan.  Talked about chemo and radiation treatments, planning to start next week.  Dose of Decadron, if applicable: He was taking 1 tablet (4mg ) PO Q8h for 14 days (04/23/2018-05/07/2018).  Changed to 2 mg once a day starting 05/08/2018  Recent neurologic symptoms, if any:   Seizures: No  Headaches: No  Nausea: No  Dizziness/ataxia: Little unsteady, usually after he is tired.  Difficulty with hand coordination: Left hand  Focal numbness/weakness: Left hand mostly, does have some tremors on that side.  Visual deficits/changes: Blurred vision on the left side  Confusion/Memory deficits: Some confusion.  No memory deficits.  Takes a little longer to process, has to really try hard to focus and concentrate.  Painful bone metastases at present, if any:     SAFETY ISSUES:  Prior radiation? No  Pacemaker/ICD? No  Possible current pregnancy? No  Is the patient on  methotrexate? No  Additional Complaints / other details:

## 2018-05-08 ENCOUNTER — Encounter: Payer: Self-pay | Admitting: Internal Medicine

## 2018-05-08 ENCOUNTER — Inpatient Hospital Stay: Payer: PRIVATE HEALTH INSURANCE | Attending: Internal Medicine | Admitting: Internal Medicine

## 2018-05-08 ENCOUNTER — Ambulatory Visit
Admission: RE | Admit: 2018-05-08 | Discharge: 2018-05-08 | Disposition: A | Discharge status: Home / Self Care | Payer: PRIVATE HEALTH INSURANCE | Source: Ambulatory Visit | Attending: Radiation Oncology | Admitting: Radiation Oncology

## 2018-05-08 ENCOUNTER — Encounter: Payer: Self-pay | Admitting: Radiation Oncology

## 2018-05-08 ENCOUNTER — Other Ambulatory Visit: Payer: Self-pay

## 2018-05-08 VITALS — BP 115/77 | HR 67 | Temp 98.2°F | Resp 18 | Ht 72.0 in | Wt 247.7 lb

## 2018-05-08 VITALS — BP 115/74 | HR 62 | Temp 98.2°F | Resp 18 | Ht 72.0 in | Wt 247.6 lb

## 2018-05-08 DIAGNOSIS — F418 Other specified anxiety disorders: Secondary | ICD-10-CM

## 2018-05-08 DIAGNOSIS — L409 Psoriasis, unspecified: Secondary | ICD-10-CM | POA: Diagnosis not present

## 2018-05-08 DIAGNOSIS — C719 Malignant neoplasm of brain, unspecified: Secondary | ICD-10-CM

## 2018-05-08 DIAGNOSIS — Z79899 Other long term (current) drug therapy: Secondary | ICD-10-CM | POA: Diagnosis not present

## 2018-05-08 DIAGNOSIS — Z923 Personal history of irradiation: Secondary | ICD-10-CM | POA: Diagnosis not present

## 2018-05-08 DIAGNOSIS — Z87891 Personal history of nicotine dependence: Secondary | ICD-10-CM | POA: Insufficient documentation

## 2018-05-08 DIAGNOSIS — C712 Malignant neoplasm of temporal lobe: Secondary | ICD-10-CM

## 2018-05-08 DIAGNOSIS — M129 Arthropathy, unspecified: Secondary | ICD-10-CM | POA: Insufficient documentation

## 2018-05-08 DIAGNOSIS — R51 Headache: Secondary | ICD-10-CM | POA: Insufficient documentation

## 2018-05-08 DIAGNOSIS — I10 Essential (primary) hypertension: Secondary | ICD-10-CM | POA: Insufficient documentation

## 2018-05-08 DIAGNOSIS — Z87442 Personal history of urinary calculi: Secondary | ICD-10-CM

## 2018-05-08 DIAGNOSIS — Z9221 Personal history of antineoplastic chemotherapy: Secondary | ICD-10-CM | POA: Diagnosis not present

## 2018-05-08 HISTORY — DX: Psoriasis, unspecified: L40.9

## 2018-05-08 MED ORDER — TEMOZOLOMIDE 180 MG PO CAPS: 75.0000 mg/m2/d | ORAL_CAPSULE | Freq: Every day | ORAL | 0 refills | Status: DC

## 2018-05-08 MED ORDER — ONDANSETRON HCL 8 MG PO TABS: 8.0000 mg | ORAL_TABLET | Freq: Two times a day (BID) | ORAL | 1 refills | Status: DC | PRN

## 2018-05-08 NOTE — Progress Notes (Signed)
START ON PATHWAY REGIMEN - Neuro     One cycle, daily for 42 days concurrent with RT:     Temozolomide   **Always confirm dose/schedule in your pharmacy ordering system**    Patient Characteristics: Glioblastoma, Newly Diagnosed / Treatment Naive, Good Performance Status and/or Younger Patient Disease Classification: Glioma Disease Classification: Glioblastoma Disease Status: Newly Diagnosed / Treatment Naive Performance Status: Good Performance Status and/or Younger Patient Intent of Therapy: Non-Curative / Palliative Intent, Discussed with Patient

## 2018-05-08 NOTE — Progress Notes (Signed)
Radiation Oncology         [phone]) [phone] ________________________________  Name: Barry Rios        MRN: [id number]  Date of Service: 05/08/2018 DOB: [birth year]  [id number], Barry Rinks, MD  Barry Roers, MD     REFERRING PHYSICIAN: Tamsen Roers, MD   DIAGNOSIS: The encounter diagnosis was Glioblastoma [facility]).   HISTORY OF PRESENT ILLNESS: Barry Rios is a 47 y.o. male seen at the request of Dr. Mickeal Skinner for a new diagnosis of Glioblastoma of the right temporal lobe. The patient in retrospect noted some intermittent blurred vision in the last 6 months and developed new onset of headaches a few weeks ago that progressed in severity and frequency. He ultimately was seen and an MRI on 04/17/18 revealed a 6.3 x 3.9 cm mass in the right temporal occipital junction with vasogenic edema. The patient underwent resection on 04/21/18 and final pathology revealed a Glioblastoma. Postoperative MRI on 04/22/18 revealed postoperative changes in the craniotomy site and no enhancing tumor was noted. There was still mass effect with 7 mm right to left shift. The patient will change his dexamethasone dose to 2 mg daily as of today following his meeting with Dr. Mickeal Skinner. He comes to discuss the role of adjuvant radiotherapy for this cancer.   PREVIOUS RADIATION THERAPY: No   PAST MEDICAL HISTORY:  Past Medical History:  Diagnosis Date  . Anxiety   . Arthritis    "right knee and shoulder" (04/16/2018)  . Brain tumor ([place]) 04/21/2018  . Depression   . Fatty liver   . Headache    "weekly" (04/16/2018)  . History of kidney stones   . Hypertension   . Kidney infection    when he was a child- hospitalized  . 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 Psoriasis        PAST SURGICAL HISTORY: Past Surgical History:  Procedure Laterality Date  . APPLICATION OF CRANIAL NAVIGATION Right 04/21/2018   Procedure: APPLICATION OF CRANIAL NAVIGATION;  Surgeon: Newman Pies, MD;  Location: [place];  Service: Neurosurgery;  Laterality:  Right;  APPLICATION OF CRANIAL NAVIGATION  . CRANIOTOMY Right 04/21/2018   Procedure: STEREOTACTIC RIGHT OCCIPITAL CRANIOTOMY TUMOR EXCISION;  Surgeon: Newman Pies, MD;  Location: [place];  Service: Neurosurgery;  Laterality: Right;  STEREOTACTIC RIGHT OCCIPITALCRANIOTOMY TUMOR EXCISION  . HAND SURGERY Left 2009   "reattached nerves and veins"     FAMILY HISTORY:  Family History  Problem Relation Age of Onset  . Nephrolithiasis Brother   . Bladder Cancer Neg Hx   . Kidney cancer Neg Hx   . Prostate cancer Neg Hx      SOCIAL HISTORY:  reports that he quit smoking about 12 years ago. His smoking use included cigarettes. He has a 10.00 pack-year smoking history. His smokeless tobacco use includes chew. He reports that he drinks alcohol. He reports that he does not use drugs. The patient is married and lives in [place]. He works as a Furniture conservator/restorer as a Librarian, academic. He has two adult children.   ALLERGIES: Penicillins   MEDICATIONS:  Current Outpatient Medications  Medication Sig Dispense Refill  . acetaminophen (TYLENOL) 325 MG tablet Take 2 tablets (650 mg total) by mouth every 6 (six) hours as needed for mild pain or headache. 30 tablet 0  . levETIRAcetam (KEPPRA) 500 MG tablet Take 1 tablet (500 mg total) by mouth 2 (two) times daily. 60 tablet 1  . loratadine (CLARITIN) 10 MG tablet Take 10 mg by mouth daily.    Marland Kitchen  Multiple Vitamin (MULTIVITAMIN) capsule Take 1 capsule by mouth daily.    . sertraline (ZOLOFT) 100 MG tablet Take 100 mg by mouth daily.     No current facility-administered medications for this encounter.      REVIEW OF SYSTEMS: On review of systems, the patient reports that he is doing well overall. He denies any headaches but does have left hand weakness at times. He denies any chest pain, shortness of breath, cough, fevers, chills, night sweats, unintended weight changes. He denies any bowel or bladder disturbances, and denies abdominal pain, nausea or vomiting.  He denies any new musculoskeletal or joint aches or pains. A complete review of systems is obtained and is otherwise negative.     PHYSICAL EXAM:  Wt Readings from Last 3 Encounters:  05/08/18 247 lb 9.6 oz (112.3 kg)  05/08/18 247 lb 11.2 oz (112.4 kg)  04/21/18 246 lb 0.5 oz (111.6 kg)   Temp Readings from Last 3 Encounters:  05/08/18 98.2 F (36.8 C) (Oral)  05/08/18 98.2 F (36.8 C) (Oral)  04/23/18 97.6 F (36.4 C) (Oral)   BP Readings from Last 3 Encounters:  05/08/18 115/74  05/08/18 115/77  04/23/18 121/67   Pulse Readings from Last 3 Encounters:  05/08/18 62  05/08/18 67  04/23/18 (!) 55   Pain Assessment Pain Score: 0-No pain/10  In general this is a well appearing caucasian male in no acute distress. He is alert and oriented x4 and appropriate throughout the examination. HEENT reveals that the patient is normocephalic, atraumatic with a well healed incision of the posterior right scalp. EOMs are intact. Skin is intact without any evidence of gross lesions.  Cardiopulmonary assessment is negative for acute distress and he exhibits normal effort.    ECOG = 1  0 - Asymptomatic (Fully active, able to carry on all predisease activities without restriction)  1 - Symptomatic but completely ambulatory (Restricted in physically strenuous activity but ambulatory and able to carry out work of a light or sedentary nature. For example, light housework, office work)  2 - Symptomatic, <50% in bed during the day (Ambulatory and capable of all self care but unable to carry out any work activities. Up and about more than 50% of waking hours)  3 - Symptomatic, >50% in bed, but not bedbound (Capable of only limited self-care, confined to bed or chair 50% or more of waking hours)  4 - Bedbound (Completely disabled. Cannot carry on any self-care. Totally confined to bed or chair)  5 - Death   Barry Rios MM, Barry Rios, Tormey DC, et al. [phone]). "Toxicity and response criteria of the  [facility] Group". [place] Oncol. 5 (6): 649-55    LABORATORY DATA:  Lab Results  Component Value Date   WBC 26.9 (H) 04/22/2018   HGB 14.5 04/22/2018   HCT 42.2 04/22/2018   MCV 90.8 04/22/2018   PLT 782 (H) 04/22/2018   Lab Results  Component Value Date   NA 134 (L) 04/22/2018   K 4.2 04/22/2018   CL 104 04/22/2018   CO2 22 04/22/2018   No results found for: ALT, AST, GGT, ALKPHOS, BILITOT    RADIOGRAPHY: Mr Jeri Cos Wo Contrast  Result Date: 04/22/2018 CLINICAL DATA:  Follow-up examination status post craniotomy for brain tumor resection. EXAM: MRI HEAD WITHOUT AND WITH CONTRAST TECHNIQUE: Multiplanar, multiecho pulse sequences of the brain and surrounding structures were obtained without and with intravenous contrast. CONTRAST:  75m MULTIHANCE GADOBENATE DIMEGLUMINE 529 MG/ML IV SOLN  COMPARISON:  Previous MRI from 04/17/2018. FINDINGS: Brain: Postoperative changes from interval right parieto-occipital craniotomy for tumor resection seen. Scattered postoperative pneumocephalus overlies the right cerebral convexity. Previously seen right parietooccipital tumor has largely been resected, with heterogeneous postoperative blood products seen in the resection cavity. Resection site extends to the underlying atrium of the right lateral ventricle. Appended below enhancement about the occipital horn of the right lateral ventricle favored to be postoperative in nature. Small area of restricted diffusion within the adjacent right occipital pole consistent with a small peri resection cavity infarct (series 3, image 29). Tumor itself has been largely resected. Scattered areas of fairly thin peripheral enhancement about the resection cavity favored to be postoperative in nature. No obvious residual enhancing tumor identified. Persistent surrounding T2/FLAIR signal intensity within the surrounding right parieto-occipital and temporal regions consistent with vasogenic edema and/or  nonenhancing infiltrative tumor. Persistent mass effect with partial effacement of the right lateral ventricle and 7 mm of right-to-left shift. No hydrocephalus or ventricular trapping. Basilar cisterns remain patent. Otherwise, appearance of the brain is stable and normal in appearance. No other acute infarct. No other mass lesion or midline shift. Normal expected postoperative dural enhancement noted. No other abnormal enhancement. Major dural sinuses are patent. Vascular: Major intracranial vascular flow voids are maintained. Skull and upper cervical spine: Craniocervical junction within normal limits. Upper cervical spine within normal limits. Post craniotomy changes present at the right parieto-occipital scalp. Associated scalp edema with overlying skin staples. Heterogeneous gas and fluid collection overlying the craniotomy bone flap measures 3.8 x 1.3 x 8.6 cm (series 7, image 20). Sinuses/Orbits: Globes and orbital soft tissues within normal limits. Paranasal sinuses are clear. No mastoid effusion. Inner ear structures normal. Other: None. IMPRESSION: 1. Postoperative changes from interval right parieto-occipital craniotomy for brain tumor resection as above. Gross total resection with no residual tumor identified. 2. Residual regional mass effect and edema with 7 mm of right-to-left shift. Electronically Signed   By: Jeannine Boga M.D.   On: 04/22/2018 14:20   Mr Jeri Cos SW Contrast  Result Date: 04/17/2018 CLINICAL DATA:  Headaches, hypertension and vision loss. Right occipital mass. EXAM: MRI HEAD WITHOUT AND WITH CONTRAST TECHNIQUE: Multiplanar, multiecho pulse sequences of the brain and surrounding structures were obtained without and with intravenous contrast. CONTRAST:  60m MULTIHANCE GADOBENATE DIMEGLUMINE 529 MG/ML IV SOLN COMPARISON:  None. FINDINGS: BRAIN: The midline structures are normal. There is a large mass centered in the right temporal occipital junction with a large amount of  surrounding vasogenic edema. The mass is heterogeneous with multiple internal foci of blood products. Contrast enhancement is primarily peripheral with a more solid component at the superior posterior aspect of the mass. The mass measures approximately 6.3 x 3.9 cm. The surrounding vasogenic edema extends into the right parietal lobe. There is mass effect on the lateral ventricles with effacement of the right occipital horn. There is 6 mm of leftward midline shift. No remote foci of contrast enhancement. Diffusion tensor imaging shows mass significant effect on the right optic radiation. VASCULAR: Major intracranial arterial and venous sinus flow voids are preserved. SKULL AND UPPER CERVICAL SPINE: The visualized skull base, calvarium, upper cervical spine and extracranial soft tissues are normal. SINUSES/ORBITS: No fluid levels or advanced mucosal thickening. No mastoid or middle ear effusion. Normal orbits. IMPRESSION: 1. Large tumor within the right temporal/occipital lobe with surrounding vasogenic edema. 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 2. 6 mm of leftward midline shift with mass effect on  the lateral ventricles. No herniation. 3. Marked mass effect on the right optic radiation visible on diffusion tensor imaging. Electronically Signed   By: Ulyses Jarred M.D.   On: 04/17/2018 13:36       IMPRESSION/PLAN: 1. Glioblastoma of the right temporal lobe. Dr. Lisbeth Renshaw discusses the pathology findings and reviews the nature of primary brain malignancy and outlines the rationale for concurrent chemosensitization with Temodar. He has met with Dr. Mickeal Skinner and has plans to try to start treatment next week. We discussed the risks, benefits, short, and long term effects of radiotherapy, and the patient is interested in proceeding. Dr. Lisbeth Renshaw discusses the delivery and logistics of radiotherapy and anticipates a course of 6 weeks of radiotherapy. Written consent is obtained and placed in the chart, a  copy was provided to the patient. He will return tomorrow for simulation.   The above documentation reflects my direct findings during this shared patient visit. Please see the separate note by Dr. Lisbeth Renshaw on this date for the remainder of the patient's plan of care.    Carola Rhine, PAC

## 2018-05-08 NOTE — Progress Notes (Signed)
[facility] [place], [place] [address] [phone]   New Patient Evaluation  Date of Service: 05/08/18 Patient Name: Barry Rios Patient MRN: [id number] Patient DOB: 02/27/1971 Provider: Ventura Sellers, MD  Identifying Statement:  Barry Rios is a 47 y.o. male with right parietal glioblastoma who presents for initial consultation and evaluation.    Referring Provider: Tamsen Roers, [place], [place] [address]  Oncologic History:   Glioblastoma ([facility] [place])   04/21/2018 Surgery    Presents with headaches, right parietal mass discovered on MRI brain.  Craniotomy and resection performed by Dr. Arnoldo Morale demonstrates glioblastoma.       Biomarkers:  MGMT Unknown.  IDH 1/2 Unknown.  EGFR Unknown  TERT Unknown   History of Present Illness: The patient's records from the referring physician were obtained and reviewed and the patient interviewed to confirm this HPI.  Barry Rios presented to medical attention earlier this month with progressive visual and visual spatial impairment, as well as headaches.  He describes worsening "blurriness all on the left side" without total loss of visual input.  He also was having difficulty with orientation- he got lost driving home from work on consecutive days, along a route that he knows very well.  Headaches were moderate but daily and occurring throughout the day, including the morning.  These changes prompted a visit to the emergency department and an MRI brain, which demonstrated an enhancing left temporal mass.  He then underwent craniotomy and resection with Dr. Arnoldo Morale on 04/21/18; path demonstrated glioblastoma.  Following surgery, he noted improvement in symptoms, in particular his visual/spatial problems.  He is currently weaning down off steroids.  He presents today to review his pathology and formulate a treatment plan moving forward.     Medications: Current Outpatient  Medications on File Prior to Visit  Medication Sig Dispense Refill  . acetaminophen (TYLENOL) 325 MG tablet Take 2 tablets (650 mg total) by mouth every 6 (six) hours as needed for mild pain or headache. 30 tablet 0  . HYDROcodone-acetaminophen (NORCO/VICODIN) 5-325 MG tablet Take 1-2 tablets by mouth every 4 (four) hours as needed for moderate pain. 30 tablet 0  . levETIRAcetam (KEPPRA) 500 MG tablet Take 1 tablet (500 mg total) by mouth 2 (two) times daily. 60 tablet 1  . Multiple Vitamin (MULTIVITAMIN) capsule Take 1 capsule by mouth daily.    . sertraline (ZOLOFT) 100 MG tablet Take 100 mg by mouth daily.    . tamsulosin (FLOMAX) 0.4 MG CAPS capsule Take 1 capsule (0.4 mg total) by mouth daily. (Patient not taking: Reported on 04/16/2018) 30 capsule 0   No current facility-administered medications on file prior to visit.     Allergies:  Allergies  Allergen Reactions  . Penicillins Hives    Has patient had a PCN reaction causing immediate rash, facial/tongue/throat swelling, SOB or lightheadedness with hypotension: Yes Has patient had a PCN reaction causing severe rash involving mucus membranes or skin necrosis: No Has patient had a PCN reaction that required hospitalization: No Has patient had a PCN reaction occurring within the last 10 years: Yes If all of the above answers are "NO", then may proceed with Cephalosporin use.    Past Medical History:  Past Medical History:  Diagnosis Date  . Anxiety   . Arthritis    "right knee and shoulder" (04/16/2018)  . Brain tumor ([facility]) 04/21/2018  . Depression   . Fatty liver   .  Headache    "weekly" (04/16/2018)  . History of kidney stones   . Hypertension   . Kidney infection    when he was a child- hospitalized  . Occipital mass 04/2018   Past Surgical History:  Past Surgical History:  Procedure Laterality Date  . APPLICATION OF CRANIAL NAVIGATION Right 04/21/2018   Procedure: APPLICATION OF CRANIAL NAVIGATION;  Surgeon: Newman Pies, MD;  Location: [facility];  Service: Neurosurgery;  Laterality: Right;  APPLICATION OF CRANIAL NAVIGATION  . CRANIOTOMY Right 04/21/2018   Procedure: STEREOTACTIC RIGHT OCCIPITAL CRANIOTOMY TUMOR EXCISION;  Surgeon: Newman Pies, MD;  Location: [facility];  Service: Neurosurgery;  Laterality: Right;  STEREOTACTIC RIGHT OCCIPITALCRANIOTOMY TUMOR EXCISION  . HAND SURGERY Left 2009   "reattached nerves and veins"   Social History:  Social History   Socioeconomic History  . Marital status: Married    Spouse name: Not on file  . Number of children: Not on file  . Years of education: Not on file  . Highest education level: Not on file  Occupational History  . Not on file  Social Needs  . Financial resource strain: Not on file  . Food insecurity:    Worry: Not on file    Inability: Not on file  . Transportation needs:    Medical: Not on file    Non-medical: Not on file  Tobacco Use  . Smoking status: Former Smoker    Packs/day: 0.50    Years: 20.00    Pack years: 10.00    Types: Cigarettes    Last attempt to quit: 2007    Years since quitting: 12.4  . Smokeless tobacco: Current User    Types: Chew  . Tobacco comment: 04/16/2018 1 pouch/day nicotine   Substance and Sexual Activity  . Alcohol use: Yes    Comment: 04/16/2018 "might have 1 beer/month"  . Drug use: No  . Sexual activity: Not on file  Lifestyle  . Physical activity:    Days per week: Not on file    Minutes per session: Not on file  . Stress: Not on file  Relationships  . Social connections:    Talks on phone: Not on file    Gets together: Not on file    Attends religious service: Not on file    Active member of club or organization: Not on file    Attends meetings of clubs or organizations: Not on file    Relationship status: Not on file  . Intimate partner violence:    Fear of current or ex partner: Not on file    Emotionally abused: Not on file    Physically abused: Not on file    Forced sexual activity:  Not on file  Other Topics Concern  . Not on file  Social History Narrative  . Not on file   Family History:  Family History  Problem Relation Age of Onset  . Nephrolithiasis Brother   . Bladder Cancer Neg Hx   . Kidney cancer Neg Hx   . Prostate cancer Neg Hx     Review of Systems: Constitutional: Denies fevers, chills or abnormal weight loss Eyes: Denies blurriness of vision Ears, nose, mouth, throat, and face: Denies mucositis or sore throat Respiratory: Denies cough, dyspnea or wheezes Cardiovascular: Denies palpitation, chest discomfort or lower extremity swelling Gastrointestinal:  Denies nausea, constipation, diarrhea GU: Denies dysuria or incontinence Skin: Denies abnormal skin rashes Neurological: Per HPI Musculoskeletal: Denies joint pain, back or neck discomfort. No decrease in ROM  Behavioral/Psych: Denies anxiety, disturbance in thought content, and mood instability  Physical Exam: Vitals:   05/08/18 0911  BP: 115/77  Pulse: 67  Resp: 18  Temp: 98.2 F (36.8 C)  SpO2: 100%   KPS: 80. General: Alert, cooperative, pleasant, in no acute distress Head: Craniotomy scar noted, dry and intact. EENT: No conjunctival injection or scleral icterus. Oral mucosa moist Lungs: Resp effort normal Cardiac: Regular rate and rhythm Abdomen: Soft, non-distended abdomen Skin: No rashes cyanosis or petechiae. Extremities: No clubbing or edema  Neurologic Exam: Mental Status: Awake, alert, attentive to examiner. Oriented to self and environment. Language is fluent with intact comprehension.  Cranial Nerves: Visual acuity is grossly normal. Visual fields are full grossly, but some impairment is noted in left hemi-field. Extra-ocular movements intact. No ptosis. Face is symmetric, tongue midline. Motor: Tone and bulk are normal. Power is full in both arms and legs. Reflexes are symmetric, no pathologic reflexes present. Intact finger to nose bilaterally Sensory: Intact to light  touch and temperature Gait: Normal and tandem gait is normal.   Labs: I have reviewed the data as listed    Component Value Date/Time   NA 134 (L) 04/22/2018 0530   K 4.2 04/22/2018 0530   CL 104 04/22/2018 0530   CO2 22 04/22/2018 0530   GLUCOSE 139 (H) 04/22/2018 0530   BUN 21 (H) 04/22/2018 0530   CREATININE 0.98 04/22/2018 0530   CALCIUM 8.5 (L) 04/22/2018 0530   GFRNONAA >60 04/22/2018 0530   GFRAA >60 04/22/2018 0530   Lab Results  Component Value Date   WBC 26.9 (H) 04/22/2018   HGB 14.5 04/22/2018   HCT 42.2 04/22/2018   MCV 90.8 04/22/2018   PLT 782 (H) 04/22/2018    Imaging: [facility] Clinician Interpretation: I have personally reviewed the CNS images as listed.  My interpretation, in the context of the patient's clinical presentation, is post-surgical changes  Mr Barry Rios Wo Contrast  Result Date: 04/22/2018 CLINICAL DATA:  Follow-up examination status post craniotomy for brain tumor resection. EXAM: MRI HEAD WITHOUT AND WITH CONTRAST TECHNIQUE: Multiplanar, multiecho pulse sequences of the brain and surrounding structures were obtained without and with intravenous contrast. CONTRAST:  85m MULTIHANCE GADOBENATE DIMEGLUMINE 529 MG/ML IV SOLN COMPARISON:  Previous MRI from 04/17/2018. FINDINGS: Brain: Postoperative changes from interval right parieto-occipital craniotomy for tumor resection seen. Scattered postoperative pneumocephalus overlies the right cerebral convexity. Previously seen right parietooccipital tumor has largely been resected, with heterogeneous postoperative blood products seen in the resection cavity. Resection site extends to the underlying atrium of the right lateral ventricle. Appended below enhancement about the occipital horn of the right lateral ventricle favored to be postoperative in nature. Small area of restricted diffusion within the adjacent right occipital pole consistent with a small peri resection cavity infarct (series 3, image 29). Tumor itself  has been largely resected. Scattered areas of fairly thin peripheral enhancement about the resection cavity favored to be postoperative in nature. No obvious residual enhancing tumor identified. Persistent surrounding T2/FLAIR signal intensity within the surrounding right parieto-occipital and temporal regions consistent with vasogenic edema and/or nonenhancing infiltrative tumor. Persistent mass effect with partial effacement of the right lateral ventricle and 7 mm of right-to-left shift. No hydrocephalus or ventricular trapping. Basilar cisterns remain patent. Otherwise, appearance of the brain is stable and normal in appearance. No other acute infarct. No other mass lesion or midline shift. Normal expected postoperative dural enhancement noted. No other abnormal enhancement. Major dural sinuses are patent. Vascular: Major intracranial  vascular flow voids are maintained. Skull and upper cervical spine: Craniocervical junction within normal limits. Upper cervical spine within normal limits. Post craniotomy changes present at the right parieto-occipital scalp. Associated scalp edema with overlying skin staples. Heterogeneous gas and fluid collection overlying the craniotomy bone flap measures 3.8 x 1.3 x 8.6 cm (series 7, image 20). Sinuses/Orbits: Globes and orbital soft tissues within normal limits. Paranasal sinuses are clear. No mastoid effusion. Inner ear structures normal. Other: None. IMPRESSION: 1. Postoperative changes from interval right parieto-occipital craniotomy for brain tumor resection as above. Gross total resection with no residual tumor identified. 2. Residual regional mass effect and edema with 7 mm of right-to-left shift. Electronically Signed   By: Jeannine Boga M.D.   On: 04/22/2018 14:20   Mr Barry Rios GY Contrast  Result Date: 04/17/2018 CLINICAL DATA:  Headaches, hypertension and vision loss. Right occipital mass. EXAM: MRI HEAD WITHOUT AND WITH CONTRAST TECHNIQUE: Multiplanar,  multiecho pulse sequences of the brain and surrounding structures were obtained without and with intravenous contrast. CONTRAST:  29m MULTIHANCE GADOBENATE DIMEGLUMINE 529 MG/ML IV SOLN COMPARISON:  None. FINDINGS: BRAIN: The midline structures are normal. There is a large mass centered in the right temporal occipital junction with a large amount of surrounding vasogenic edema. The mass is heterogeneous with multiple internal foci of blood products. Contrast enhancement is primarily peripheral with a more solid component at the superior posterior aspect of the mass. The mass measures approximately 6.3 x 3.9 cm. The surrounding vasogenic edema extends into the right parietal lobe. There is mass effect on the lateral ventricles with effacement of the right occipital horn. There is 6 mm of leftward midline shift. No remote foci of contrast enhancement. Diffusion tensor imaging shows mass significant effect on the right optic radiation. VASCULAR: Major intracranial arterial and venous sinus flow voids are preserved. SKULL AND UPPER CERVICAL SPINE: The visualized skull base, calvarium, upper cervical spine and extracranial soft tissues are normal. SINUSES/ORBITS: No fluid levels or advanced mucosal thickening. No mastoid or middle ear effusion. Normal orbits. IMPRESSION: 1. Large tumor within the right temporal/occipital lobe with surrounding vasogenic edema. 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 2. 6 mm of leftward midline shift with mass effect on the lateral ventricles. No herniation. 3. Marked mass effect on the right optic radiation visible on diffusion tensor imaging. Electronically Signed   By: KUlyses JarredM.D.   On: 04/17/2018 13:36    Pathology:  Assessment/Plan 1. Glioblastoma ([facility]  We appreciate the opportunity to participate in the care of Barry Rios  He is clinically stable following his gross total resection.  Enhancing remnants on post-contras MRI are likely blood  products. We discussed his pathology and potential treatment plans in detail.  Notable bio-markers are still pending at this time.  We recommended initiating treatment with intensity modulated radiation therapy and concurrent daily Temozolomide 75 mg/m2 for 42 day cycle.  We discussed side of effects of Temodar including constipation and cytopenias.  Zofran will prescribed for home use for nausea/vomiting.   Chemotherapy should be held for the following:  ANC less than 1,000  Platelets less than 100,000  LFT or creatinine greater than 2x ULN  If clinical concerns/contraindications develop  He should return to clinic during weeks 3 and 5 of radiation with full set of labs for evaluation.    He should continue to wean dexamethasone down to '2mg'$  daily, and then discontinue after one week.  Screening for potential clinical trials was performed and  discussed using eligibility criteria for active protocols at [facility], loco-regional tertiary centers, as well as national database available on [email or web address].com.    We spent twenty additional minutes teaching regarding the natural history, biology, and historical experience in the treatment of brain tumors. We then discussed in detail the current recommendations for therapy focusing on the mode of administration, mechanism of action, anticipated toxicities, and quality of life issues associated with this plan. We also provided teaching sheets for the patient to take home as an additional resource.  All questions were answered. The patient knows to call the clinic with any problems, questions or concerns. No barriers to learning were detected.  The total time spent in the encounter was 60 minutes and more than 50% was on counseling and review of test results   Ventura Sellers, MD Medical Director of Neuro-Oncology [facility] [place] 05/08/18 9:07 AM

## 2018-05-09 ENCOUNTER — Telehealth: Payer: Self-pay | Admitting: Pharmacist

## 2018-05-09 ENCOUNTER — Telehealth: Payer: Self-pay | Admitting: Pharmacy Technician

## 2018-05-09 DIAGNOSIS — C712 Malignant neoplasm of temporal lobe: Secondary | ICD-10-CM

## 2018-05-09 NOTE — Telephone Encounter (Signed)
Oral Oncology Patient Advocate Encounter  Received notification from [place] that prior authorization for Temodar is required.  PA submitted on CoverMyMeds Key [id number] Status is pending  Oral Oncology Clinic will continue to follow.  Fabio Asa. Melynda Keller, [place] Patient [place] [phone] 05/09/2018 8:36 AM

## 2018-05-09 NOTE — Telephone Encounter (Signed)
Oral Oncology Pharmacist Encounter  Received new prescription for Temodar (temozolomide) for the treatment of newly diagnosed glioblastoma multiforme in conjunction with radiation therapy, planned duration 6 weeks of concurrent phase. Temodar may be continued for adjuvant treatment after completion of concurrent phase.  Labs from Epic assessed, [facility] for treatment. No assessment of hepatic function on existing labs in Epic No adjustment for hepatic function provided by manufacturer Labs will be repeated during treatment  Current medication list in Epic reviewed, no DDIs with Temodar identified.  Prescription will be e-scribed to [facility] for dispensing per insurance requirement. Insurance authorization is required and has been submitted.  Oral Oncology Clinic will continue to follow for insurance authorization, copayment issues, initial counseling and start date.  Johny Drilling, PharmD, BCPS, BCOP  05/09/2018 11:19 AM Oral Oncology Clinic [phone]

## 2018-05-12 MED ORDER — TEMOZOLOMIDE 180 MG PO CAPS: 75.0000 mg/m2/d | ORAL_CAPSULE | Freq: Every day | ORAL | 0 refills | Status: AC

## 2018-05-12 NOTE — Telephone Encounter (Signed)
Oral Chemotherapy Pharmacist Encounter   I spoke with patient for overview of: Temodar.  Counseled patient on administration, dosing, side effects, monitoring, drug-food interactions, safe handling, storage, and disposal.  Patient will take Temodar 180mg  capsules, 1 capsule (180mg ) by mouth once daily, may take at bedtime and on an empty stomach to decrease nausea and vomiting.  Patient will take Temodar concurrent with radiation for 42 days straight.  Patient understands dose and schedule of Temodar will change during adjuvant phase.  Temodar and radiation start date: TBD, CT simulation 05/15/18   Patient will take Zofran 8mg  tablet, 1 tablet by mouth 30-60 min prior to Temodar dose to help decrease N/V once starting adjuvant therapy. Prophylactic Zofran will not be used at initiation of concurrent phase. He has already picked up his Zofran Rx.   Adverse effects include but are not limited to: nausea, vomiting, anorexia, GI upset, rash, drug fever, and fatigue.  Reviewed with patient importance of keeping a medication schedule and plan for any missed doses.  Mr. Sippel voiced understanding and appreciation.   All questions answered. Medication reconciliation performed and medication/allergy list updated.  Patient updated on [facility] (ph: [phone]) as dispensing pharmacy. Prescription e-scribed today to dispensing pharmacy.  We discussed the specialty pharmacy process and possible copayment issues that might arise.  Patient instructed to call dispensing pharmacy on Wednesday (96/5/19) for status check if he had not yet heard from them to schedule shipment of Temodar.  Mr. Carithers understands to wait until 1st day of radiation to start Temodar.  Patient knows to call the office with questions or concerns. Oral Oncology Clinic will continue to follow.  Thank you,  Johny Drilling, PharmD, BCPS, BCOP  05/12/2018  3:37 PM Oral Oncology Clinic [phone]

## 2018-05-12 NOTE — Telephone Encounter (Signed)
Oral Oncology Pharmacist Encounter  Received notification from [employer] that insurance authorization for Temodar 180mg  capsules has been approved  Ref# [id number] Effective dates: 05/09/18-05/10/19  Oral Oncology Clinic will continue to follow.  Johny Drilling, PharmD, BCPS, BCOP  05/12/2018 3:47 PM Oral Oncology Clinic [phone]

## 2018-05-13 ENCOUNTER — Telehealth: Payer: Self-pay | Admitting: *Deleted

## 2018-05-13 NOTE — Telephone Encounter (Signed)
Received request from [facility] [address] [place] requesting records.  Release attached.  Faxed to [phone]

## 2018-05-15 ENCOUNTER — Ambulatory Visit
Admission: RE | Admit: 2018-05-15 | Discharge: 2018-05-15 | Disposition: A | Discharge status: Home / Self Care | Payer: PRIVATE HEALTH INSURANCE | Source: Ambulatory Visit | Attending: Radiation Oncology | Admitting: Radiation Oncology

## 2018-05-15 ENCOUNTER — Inpatient Hospital Stay: Payer: PRIVATE HEALTH INSURANCE | Attending: Internal Medicine | Admitting: *Deleted

## 2018-05-15 ENCOUNTER — Encounter: Payer: Self-pay | Admitting: *Deleted

## 2018-05-15 DIAGNOSIS — Z510 Encounter for antineoplastic radiation therapy: Secondary | ICD-10-CM | POA: Insufficient documentation

## 2018-05-15 DIAGNOSIS — C712 Malignant neoplasm of temporal lobe: Secondary | ICD-10-CM | POA: Insufficient documentation

## 2018-05-15 DIAGNOSIS — Z006 Encounter for examination for normal comparison and control in clinical research program: Secondary | ICD-10-CM | POA: Insufficient documentation

## 2018-05-15 NOTE — Progress Notes (Signed)
Barry Rios met with [facility] RN today to review consent form for [phone] study.  See 'Informed Consent' encounter for details of today's visit. Doreatha Martin, RN, BSN, [facility] 05/15/2018 10:03 AM

## 2018-05-19 ENCOUNTER — Inpatient Hospital Stay: Payer: PRIVATE HEALTH INSURANCE

## 2018-05-19 ENCOUNTER — Other Ambulatory Visit: Payer: Self-pay | Admitting: *Deleted

## 2018-05-19 ENCOUNTER — Encounter: Payer: Self-pay | Admitting: *Deleted

## 2018-05-19 ENCOUNTER — Inpatient Hospital Stay: Payer: PRIVATE HEALTH INSURANCE | Admitting: *Deleted

## 2018-05-19 VITALS — BP 123/87 | Ht 71.25 in | Wt 246.4 lb

## 2018-05-19 DIAGNOSIS — C712 Malignant neoplasm of temporal lobe: Secondary | ICD-10-CM | POA: Diagnosis present

## 2018-05-19 DIAGNOSIS — Z006 Encounter for examination for normal comparison and control in clinical research program: Secondary | ICD-10-CM | POA: Diagnosis not present

## 2018-05-19 LAB — CMP (CANCER CENTER ONLY)
ALBUMIN: 4.3 g/dL (ref 3.5–5.0)
ALT: 68 U/L — ABNORMAL HIGH (ref 0–55)
AST: 37 U/L — AB (ref 5–34)
Alkaline Phosphatase: 65 U/L (ref 40–150)
Anion gap: 7 (ref 3–11)
BUN: 20 mg/dL (ref 7–26)
CHLORIDE: 104 mmol/L (ref 98–109)
CO2: 28 mmol/L (ref 22–29)
Calcium: 10.2 mg/dL (ref 8.4–10.4)
Creatinine: 0.96 mg/dL (ref 0.70–1.30)
GFR, Est AFR Am: >60 mL/min (ref >60)
GFR, Estimated: >60 mL/min (ref >60)
GLUCOSE: 90 mg/dL (ref 70–140)
Potassium: 4.8 mmol/L (ref 3.5–5.1)
Sodium: 139 mmol/L (ref 136–145)
Total Bilirubin: 0.5 mg/dL (ref 0.2–1.2)
Total Protein: 7.1 g/dL (ref 6.4–8.3)

## 2018-05-19 LAB — CBC WITH DIFFERENTIAL (CANCER CENTER ONLY)
BASOS ABS: 0.1 10*3/uL (ref 0.0–0.1)
BASOS PCT: 1 %
EOS PCT: 3 %
Eosinophils Absolute: 0.2 10*3/uL (ref 0.0–0.5)
HEMATOCRIT: 44.6 % (ref 38.4–49.9)
Hemoglobin: 15.1 g/dL (ref 13.0–17.1)
Lymphocytes Relative: 23 %
Lymphs Abs: 1.4 10*3/uL (ref 0.9–3.3)
MCH: 31 pg (ref 27.2–33.4)
MCHC: 33.7 g/dL (ref 32.0–36.0)
MCV: 91.8 fL (ref 79.3–98.0)
MONO ABS: 0.7 10*3/uL (ref 0.1–0.9)
Monocytes Relative: 12 %
NEUTROS ABS: 3.5 10*3/uL (ref 1.5–6.5)
Neutrophils Relative %: 61 %
PLATELETS: 366 10*3/uL (ref 140–400)
RBC: 4.86 MIL/uL (ref 4.20–5.82)
RDW: 14.2 % (ref 11.0–14.6)
WBC: 5.9 10*3/uL (ref 4.0–10.3)

## 2018-05-19 LAB — BILIRUBIN, DIRECT: Bilirubin, Direct: <0.1 mg/dL — ABNORMAL LOW (ref 0.1–0.5)

## 2018-05-19 LAB — RESEARCH LABS

## 2018-05-19 NOTE — Progress Notes (Signed)
[id number]: Baseline and Enrollment  05/19/18 9:25AM  Patient "Barry Stabs" Rios (goes by shortened middle name) has consented to participate in WF1801.  Patient requests to proceed with all Baseline procedures today, as he has made plans to travel prior to his Radiation Therapy start.  This RN made arrangements to proceed with Baseline visit procedures.  Enrollment and Baseline Procedures:  ROI: The patient completed the Release of Information form and verbalized an understanding of it's purpose and potential use.  Patient denies any questions about ROI.    Demographics:  Patient completed the study Patient Demographics and Health Behaviors form.    Blood specimen:  The patient then proceeded to the [facility] lab and had the eligibility blood work, and study specific research blood sample collected (genotyping).  The patient had not had a complete metabolic panel or complete blood count with differential performed during hospitalization for neurosurgery, so these were done today to verify eligibility.  Patient verbalizes and understanding that he will not proceed with study and study treatment with ramipril if he does not meet eligibility criteria.  This RN will contact patient to notify him, prior to the start of RT, if he does not meet study criteria and cannot proceed with the study.  This RN also informed the patient that verification with the patient's insurance had not yet been completed and the patient wanted to proceed today with the required blood work.  This RN explained that the office visits required during the course of the study would coincide with his routine visits with Dr. Mickeal Skinner.  Medical History:  The patient's reported medical history was reviewed and clarified.  History tab in Epic has been updated with additional information.    New information of note includes addition of tension headaches, bursitis in right knee, and birthmark removal at age 47 with benign findings.  Patient confirms that  he has been diagnosed with hypertension in the past but this was never treated with any medication.  The patient self monitored his blood pressure at home and his BP measured 130s-140s/90s so no intervention was required.  Concomitant Medications:  Medications tab in Epic was updated by this RN with additional information.   Patient confirmed that he completed his course of steroid (dexamethasone, last dose level of 2mg ) on May 10, 2018 (two days after his office visit with Dr. Mickeal Skinner).   New information of note includes addition of supplements (currently taking multivitamin and omega 3) and naproxen PRN  Neurocognitive testing and Questionnaire:  Testing was performed by trained Research Specialist, Remer Macho.  Patient's friend Corene Cornea and this RN left the room during the performance of the neurocognitive testing and completion of patient questionnaires.  Vital Signs: Height, weight and blood pressure were measured by this RN and are recorded under vital signs of this encounter.  Scheduling future study visits: This RN will meet with patient on the day of his first radiation treatment to dispense study medication and provide patient diary.  Patient is to have study labs drawn that Friday and will meet with this RN on the following Monday (6/24).  This RN informed the patient to contact staff if he were to have any unusual side effects, and to seek emergency care if ever needed then notify study staff.  The patient was provided with another copy of this RN's card.  The patient verbalizes an understanding.    Baseline Medical History/Adverse Event Table: Baseline  Grade (CTCAE v4.0) Relatedness Comments  Hepatobiliary disorder, other: Fatty liver 1 NA;  prior to study initiation Medical history, no interventions.  AST and ALT slightly elevated at Baseline.  Alanine aminotransferase increased 1 NA; prior to study initiation Baseline ALT 68 U/L  Aspartate aminotransferase increased 1 NA; prior to study  initiation Baseline AST 37 U/L  Blurred Visition 1 NA; prior to study initiation Medical History  Arthritis 1 NA; prior to study initiation Medical History; taking PRN naproxen sodium    The patient was thanked for his willingness to participate in the clinical trial and was thanked for his time during his visit today. Doreatha Martin, RN, BSN, [facility] 05/19/2018 2:29 PM   ________  Enrollment update:  Second RN verification of eligibility was performed by Kenton Kingfisher, RN.  Patient is eligible for study participation and will be enrolled into study participation. Doreatha Martin, RN, BSN, [facility] 05/20/2018 10:55 AM

## 2018-05-20 ENCOUNTER — Other Ambulatory Visit: Payer: Self-pay | Admitting: *Deleted

## 2018-05-20 DIAGNOSIS — C712 Malignant neoplasm of temporal lobe: Secondary | ICD-10-CM

## 2018-05-20 NOTE — Telephone Encounter (Signed)
Oral Oncology Patient Advocate Encounter  Received notification from [facility] that Barry Rios will receive his initial shipment of Temodar on 05/22/2018.   Fabio Asa. Melynda Keller, [place] Patient [place] [phone] 05/20/2018 1:49 PM

## 2018-05-20 NOTE — Progress Notes (Signed)
FMLA successfully faxed to [facility] at [phone]. Mailed copy to patient address on file.

## 2018-05-21 ENCOUNTER — Encounter: Payer: Self-pay | Admitting: *Deleted

## 2018-05-21 ENCOUNTER — Inpatient Hospital Stay: Payer: PRIVATE HEALTH INSURANCE | Admitting: *Deleted

## 2018-05-22 DIAGNOSIS — Z510 Encounter for antineoplastic radiation therapy: Secondary | ICD-10-CM | POA: Diagnosis not present

## 2018-05-23 ENCOUNTER — Other Ambulatory Visit: Payer: Self-pay | Admitting: *Deleted

## 2018-05-23 ENCOUNTER — Encounter: Payer: Self-pay | Admitting: *Deleted

## 2018-05-26 ENCOUNTER — Inpatient Hospital Stay: Payer: PRIVATE HEALTH INSURANCE | Admitting: *Deleted

## 2018-05-26 ENCOUNTER — Ambulatory Visit
Admission: RE | Admit: 2018-05-26 | Discharge: 2018-05-26 | Disposition: A | Discharge status: Home / Self Care | Payer: PRIVATE HEALTH INSURANCE | Source: Ambulatory Visit | Attending: Radiation Oncology | Admitting: Radiation Oncology

## 2018-05-26 DIAGNOSIS — C712 Malignant neoplasm of temporal lobe: Secondary | ICD-10-CM

## 2018-05-26 DIAGNOSIS — Z510 Encounter for antineoplastic radiation therapy: Secondary | ICD-10-CM | POA: Diagnosis not present

## 2018-05-26 MED ORDER — SONAFINE EX EMUL
1.0000 "application " | Freq: Once | CUTANEOUS | Status: AC
  Administered 2018-05-26: 1 via TOPICAL

## 2018-05-26 NOTE — Progress Notes (Addendum)
[id number]:  05/26/18 1:37 PM  This RN met [name] today for the purpose of dispensing investigational ramipril for the WF1801 study.  The patient is accompanied by his wife, Barry Rios, and his son Barry Rios."  The patient denies any changes to his health or medications since his last visit.  His son notes that the patient's vision may be improving some.  The patient shrugged his shoulders at this comment.  The patient is now eating fresh ginger root on a daily basis, but he is not taking any new supplements (including ginger) at this time.  This RN notified Dr. Mickeal Skinner of the ginger, and this is acceptable for the patient to continue.  This RN completed a re-check of the patient's blood pressure, and again his blood pressure was acceptable for study participation.  Because there were no significant changes in the patient's health or medications, the patient continues to be eligible for study participation in WF1801 and the patient is to begin protocol medication tonight at bedtime.  This RN picked up the study medication from the [facility], one bottle of 133 tablets of 1.21m of Ramipril, and the bottle was provided to the patient.  Information on when and how to administer the study medication was reviewed, and also precautions.  The "Administration schedule for study medication (Ramipril)" was provided to and reviewed with the patient.  The June 2019 "Monthly Patient Ramipril Medication Diary" was provided to and reviewed with the patient.  The patient and his wife were active participant in the conversation and verbalized understanding on how to take the study medication. The following topics were discussed: - One 1.245mRamipril tablet daily QHS; to take first dose at bedtime tonight.  - Notification of overfill in pull bottle, enough tablets for first six weeks of treatment. - How to take missed dose, per administration schedule instructions - Avoid potassium supplements and salt  substitutes - Avoid NSAID use, continued use of his PRN naproxen sodium is acceptable. - Encouraged adequate hydration.  Patient had water bottle with him at visit. - Discussed possible side effects, as outlined on administration schedule. - When to contact study RN or Dr. VaMickeal Skinneror any adverse effects, but seek emergency care for any emergency situations and notify emergency staff what medications you are taking. - Study RN contact information - Instructions on how to complete drug diary, marking the number of pills taken each night. - Reminder to begin temodar dosing today as well (today is patient's initial radiation therapy treatment, day 35 after his surgery). - Bring pill bottle and medication diary to each appointment with study RN.  Study schedule for the next two weeks was discussed with the patient.  The patient will have study required lab work drawn on Friday morning and meet with this RN for his Week 1 Titration visit (after Week 1, first day of Week 2), in one week.  Time preferences for Week 2 lab work and study RN visit were organized and this RN has requested the appointment with scheduling.    The patient, his wife, and son asked questions throughout the visit about how to take the study medication which was reviewed in detail, if any dietary modifications needed to be made (avoid salt substitutes which the patient does not currently use), and confirmed to begin taking both the Temodar and Ramipril today.  All questions were answered and the patient and his family deny having any other questions or concerns.  This RN escorted the patient and  his family to the Radiation Therapy waiting room.  The patient begins his Radiation therapy today.  This RN has previously notified Radiation Oncologist of patient's enrollment in WF1801.  The patient was thanked for his ongoing willingness to participate in the study and thanked for his time during the visit today.  The patient was encouraged to  call if he or his family think of any questions or concerns.   No changes to Baseline Medical History Adverse Events Baseline Medical History/Adverse Event Table: Baseline  Grade (CTCAE v4.0) Relatedness Comments  Hepatobiliary disorder, other: Fatty liver 1 NA; prior to study initiation Medical history, no interventions.  AST and ALT slightly elevated at Baseline.  Alanine aminotransferase increased 1 NA; prior to study initiation Baseline ALT 68 U/L  Aspartate aminotransferase increased 1 NA; prior to study initiation Baseline AST 37 U/L  Blurred Visition 1 NA; prior to study initiation Medical History  Arthritis 1 NA; prior to study initiation Medical History; taking PRN naproxen sodium     Doreatha Martin, RN, BSN, [facility] 05/26/2018 3:22 PM

## 2018-05-26 NOTE — Progress Notes (Signed)
Pt here for patient teaching.  Pt given Radiation and You booklet and Sonafine.  Reviewed areas of pertinence such as fatigue, hair loss, nausea and vomiting and skin changes . Pt able to give teach back of to pat skin,apply Sonafine bid and avoid applying anything to skin within 4 hours of treatment. Pt verbalizes understanding of information given and will contact nursing with any questions or concerns.     Barry Rios M. Virgal Warmuth RN, BSN      

## 2018-05-27 ENCOUNTER — Telehealth: Payer: Self-pay | Admitting: *Deleted

## 2018-05-27 ENCOUNTER — Encounter: Payer: Self-pay | Admitting: *Deleted

## 2018-05-27 ENCOUNTER — Other Ambulatory Visit: Payer: Self-pay | Admitting: *Deleted

## 2018-05-27 ENCOUNTER — Telehealth: Payer: Self-pay

## 2018-05-27 ENCOUNTER — Ambulatory Visit
Admission: RE | Admit: 2018-05-27 | Discharge: 2018-05-27 | Disposition: A | Discharge status: Home / Self Care | Payer: PRIVATE HEALTH INSURANCE | Source: Ambulatory Visit | Attending: Radiation Oncology | Admitting: Radiation Oncology

## 2018-05-27 DIAGNOSIS — C712 Malignant neoplasm of temporal lobe: Secondary | ICD-10-CM

## 2018-05-27 DIAGNOSIS — Z510 Encounter for antineoplastic radiation therapy: Secondary | ICD-10-CM | POA: Diagnosis not present

## 2018-05-27 NOTE — Progress Notes (Signed)
Completed requested paperwork from [facility] for patient.  Faxed completed paperwork to [phone]

## 2018-05-27 NOTE — Telephone Encounter (Signed)
Spoke with patient concerning a lab added on to his current schedule per 6/17 sch msg. Mailed patient a calender and letter of these changes.

## 2018-05-27 NOTE — Telephone Encounter (Signed)
[id number]:    This RN called and spoke with [name].  [name] reports that he did take his first dose of temodar and investigational study drug last night before bed.  Patient states that he feels fine today.  Patient states that he felt a little dizziness that passed immediately after his first radiation therapy treatment yesterday.  This dizziness lasted for "a minute, if that" and resolved without intervention.  This occurred immediately after the completion of the radiation treatment, while the patient was still at the [facility].  Overall patient feels well. Doreatha Martin, RN, BSN, [facility] 05/27/2018 1:38 PM

## 2018-05-28 ENCOUNTER — Ambulatory Visit
Admission: RE | Admit: 2018-05-28 | Discharge: 2018-05-28 | Disposition: A | Discharge status: Home / Self Care | Payer: PRIVATE HEALTH INSURANCE | Source: Ambulatory Visit | Attending: Radiation Oncology | Admitting: Radiation Oncology

## 2018-05-28 DIAGNOSIS — Z510 Encounter for antineoplastic radiation therapy: Secondary | ICD-10-CM | POA: Diagnosis not present

## 2018-05-29 ENCOUNTER — Ambulatory Visit
Admission: RE | Admit: 2018-05-29 | Discharge: 2018-05-29 | Disposition: A | Discharge status: Home / Self Care | Payer: PRIVATE HEALTH INSURANCE | Source: Ambulatory Visit | Attending: Radiation Oncology | Admitting: Radiation Oncology

## 2018-05-29 DIAGNOSIS — Z510 Encounter for antineoplastic radiation therapy: Secondary | ICD-10-CM | POA: Diagnosis not present

## 2018-05-30 ENCOUNTER — Inpatient Hospital Stay: Payer: PRIVATE HEALTH INSURANCE

## 2018-05-30 ENCOUNTER — Telehealth: Payer: Self-pay | Admitting: *Deleted

## 2018-05-30 ENCOUNTER — Encounter: Payer: Self-pay | Admitting: Internal Medicine

## 2018-05-30 ENCOUNTER — Ambulatory Visit
Admission: RE | Admit: 2018-05-30 | Discharge: 2018-05-30 | Disposition: A | Discharge status: Home / Self Care | Payer: PRIVATE HEALTH INSURANCE | Source: Ambulatory Visit | Attending: Radiation Oncology | Admitting: Radiation Oncology

## 2018-05-30 DIAGNOSIS — C712 Malignant neoplasm of temporal lobe: Secondary | ICD-10-CM

## 2018-05-30 DIAGNOSIS — Z510 Encounter for antineoplastic radiation therapy: Secondary | ICD-10-CM | POA: Diagnosis not present

## 2018-05-30 LAB — RESEARCH LABS

## 2018-05-30 NOTE — Telephone Encounter (Signed)
WF 1801:  Informed patient that Dr. Mickeal Skinner encourages rest and hydration to treat his cold symptoms.  Patient confirms his appointment on Monday.  Doreatha Martin, RN, BSN, [facility] 05/30/2018 11:55 AM

## 2018-06-01 NOTE — Addendum Note (Signed)
Encounter addended by: Kyung Rudd, MD on: 06/01/2018 2:09 PM  Actions taken: Sign clinical note

## 2018-06-01 NOTE — Progress Notes (Addendum)
  Radiation Oncology         [phone] ________________________________  Name: Barry Rios MRN: [id number]  Date: 05/15/2018  DOB: March 08, 1971  SIMULATION AND TREATMENT PLANNING NOTE  DIAGNOSIS:     ICD-10-CM   1. 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  Identity was confirmed.  All relevant records and images related to the planned course of therapy were reviewed.   Written consent to proceed with treatment was confirmed which was freely given after reviewing the details related to the planned course of therapy had been reviewed with the patient.  Then, the patient was set-up in a stable reproducible  supine position for radiation therapy.  CT images were obtained.  Surface markings were placed.    Medically necessary complex treatment device(s) for immobilization:   1.  Customized thermoplastic head cast.  2.  accuform device  The CT images were loaded into the planning software.  Then the target and avoidance structures were contoured.  Treatment planning then occurred.  The radiation prescription was entered and confirmed.  A total of 3 complex treatment devices were fabricated which relate to the designed radiation treatment fields. Each of these customized fields/ complex treatment devices will be used on a daily basis during the radiation course. I have requested : Intensity Modulated Radiotherapy (IMRT) is medically necessary for this case for the following reason:  Critical CNS structure avoidance - brainstem, optic chiasm, optic nerve.  The patient will undergo daily image guidance to ensure accurate localization of the target, and adequate minimize dose to the normal surrounding structures in close proximity to the target.   PLAN:  The patient will receive 46 Gy in 23 fractions initially. The patient will then receive a 14 gray boost to yield a total dose of 60  gray.  Special treatment procedure The patient will also receive concurrent chemotherapy during the treatment. The patient may therefore experience increased toxicity or side effects and the patient will be monitored for such problems. This may require extra lab work as necessary. This therefore constitutes a special treatment procedure.   ________________________________   Jodelle Gross, MD, PhD

## 2018-06-02 ENCOUNTER — Inpatient Hospital Stay: Payer: PRIVATE HEALTH INSURANCE | Admitting: *Deleted

## 2018-06-02 ENCOUNTER — Other Ambulatory Visit: Payer: Self-pay | Admitting: *Deleted

## 2018-06-02 ENCOUNTER — Ambulatory Visit
Admission: RE | Admit: 2018-06-02 | Discharge: 2018-06-02 | Disposition: A | Discharge status: Home / Self Care | Payer: PRIVATE HEALTH INSURANCE | Source: Ambulatory Visit | Attending: Radiation Oncology | Admitting: Radiation Oncology

## 2018-06-02 ENCOUNTER — Encounter: Payer: Self-pay | Admitting: *Deleted

## 2018-06-02 DIAGNOSIS — C712 Malignant neoplasm of temporal lobe: Secondary | ICD-10-CM

## 2018-06-02 DIAGNOSIS — Z510 Encounter for antineoplastic radiation therapy: Secondary | ICD-10-CM | POA: Diagnosis not present

## 2018-06-02 NOTE — Progress Notes (Signed)
[id number]:  Week 1 Titration Visit; Day 1 of Week 2  06/02/18 9:15 AM    Labs:  Blood specimen collected on Friday, 05/30/18.  Results from [place] reviewed by Dr. Mickeal Skinner.  No clinically significant abnormal lab results.  Potassium remains <5.0 mmol/L; Creatinine has not increased by 20% and GFR and has decreased by 20%.  Lab results are adequate to increase Ramipril dose per protocol.  During the visit today, this RN reviewed the lab results with the patient and provided a patient with a copy for his records.    06/02/18 9:58AM [name] "Barry Rios" presents today for the Week 1 Titration Visit.  Today is Day 1 of Week 2.  Patient is accompanied by his son, Barry Rios and daughter Barry Rios.     Vital Signs - Patient's vital signs were collected and recorded by this RN.  The patient's blood pressure is adequate to increase Ramipril dose per protocol.  Adverse Events - When asked about changes in health or medications since the last visit the following was discussed:  Patient reports that he does have some improvement in his blurry vision, he has gotten new glasses, but he still has "one spot" where his vision is not clear.  Patient had previously reported two adverse events:   Dizziness (grade 1) which was assessed by Dr. Mickeal Skinner to be unrelated to Ramipril and URI (grade 2) which was also assessed by Dr. Mickeal Skinner to be unrelated to Ramipril.  The patient has experienced no further instances of dizziness.    The URI is improved today, but is ongoing.  No new medications to treat ongoing URI.  Patient's daughter and wife also have a cold, and the patient's daughter asks best how to prevent further spread of the cold.  This RN encouraged frequent hand washing by all family members.   Patient newly reports:  Headache:  Patient reports that this is a dull ache, pain rating as 2-2.5 on a scale of 1-10.  The patient feels the dull ache behind his eyes and on the right side, "where they are beaming my  head with radiation."  The patient noted the headache beginning on 05/28/18.  The patient has started taking acetaminophen 500mg  PRN for this.  The patient has been taking this approximately twice a day, every other day.  On the alternating days patient does not need the acetaminophen because the naproxen sodium he takes PRN is effective in relieving the headache.  The patient initiated use of acetaminophen on 05/28/18.  Fatigue:  Patient reports a new fatigue.  The patient stated that he had experienced fatigue prior to his neurosurgery but that this fatigue had resolved.  He is again experiencing fatigue, requiring him to take a daily afternoon nap.  The patient began needing to take naps on the afternoon of 05/28/18 and has taken a nap 1-2 hours long daily since that date.  The patient states he is sleeping about 10 hours total daily, 1-2 hour long nap and about 8 hours of sleep each night.  The fatigue improves with rest.  The patient is not taking any medications related to this fatigue.  Patient does not report any intolerable or Grade 3 adverse events; able to increase Ramipril dose per protocol.  Medications - Patient has previously confirmed that he started investigational Ramirpril and routine chemotherapy Temodraw on 05/26/18.  Patient previously reported adding the use of a saline nasal spray, beginning on 05/28/18, to help manage the symptoms of his URI.  The new  use of intranasal saline and acetaminophen has been added to the patient's medication list.  Investigational Medication:  The patient returned his medication bottle, [id number], and his medication diary at the beginning of this visit to this RN.  This RN delivered the medication bottle to the [facility].  [place] pharmacist completed a pill count and found 126 pills remaining in the bottle.  The patient has been 100% compliant with the investigational medication.  The patient had also completed his medication diary daily, and did not report any  AEs on the medication diary.  This RN make a copy of the medication diary, and then returned the medication diary to the patient for use during Week 2.  Increase in daily dose to 2.5 mg Ramipril daily.  Okay to increase Ramipril dose, based off of current lab results, blood pressure measurement and patient reported AEs.  The patient is now to begin taking 2 tabs of Ramipril each night, beginning tonight, 06/02/18, and the patient verbalized an understanding of this.  An updated Administration schedule for Study Medication for Week 2 was provided to the patient.  The patient denies any questions or concerns.    While the patient was receiving his Radiation Therapy treatment, this RN retrieved the pill bottle from the [facility].  This RN then requested fellow Research RN Merceda Elks to provide the medication to the patient at the completion of his Radiation Therapy, and the medication was provided to [place].  Merceda Elks, RN did meet with the patient following his RT and provided the patient with the medication bottle.    Adverse Event Table: Adverse Event Grade (CTCAE v5.0) Start Date End Date Relatedness Comments  Hepatobiliary disorder, other: Fatty liver 1 Baseline ongoing NA; prior to study initiation Medical history, no interventions. AST and ALT slightly elevated at Baseline.  Alanine aminotransferase increased 1 Baseline ongoing NA; prior to study initiation Baseline ALT 68 U/L  Aspartate aminotransferase increased 1 Baseline Resolved 05/30/18 NA; prior to study initiation Baseline AST 37 U/L; Follow up AST 34 IU/L on 05/30/18  Blurred Visition 1 Baseline ongoing NA; prior to study initiation Medical History  Arthritis 1 Baseline ongoing NA; prior to study initiation Medical History; taking PRN naproxen sodium  Dizziness 1 05/26/18 05/26/18 Unrelated Lasting less than 1 minute immediately following first RT treatment; self resolved  Upper Respiratory Infection 2 05/28/18 ongoing Unrelated  Congestion, postnasal drip with sore throat and cough.  Nasal saline spray PRN.  Headache 1 05/28/18 ongoing Unrelated Taking acetaminophen and naproxen sodium PRN.  2/10 pain score with a dull ache.  Fatigue 1 05/28/18 ongoing Unrelated Relived by rest; daily 1-2 hour long nap.  Nighttime sleep has not increased.    Patient understands to contact this RN or Dr. Mickeal Skinner with any questions or concerns.  Upcoming lab appointment for Week 2 Titration was confirmed with the patient and Week 3 Titration labs and visit were tentatively scheduled.  This RN thanked the patient for his ongoing participation in research and for his efforts during this study.    Doreatha Martin, RN, BSN, [facility] 06/02/2018 12:33 PM

## 2018-06-03 ENCOUNTER — Ambulatory Visit
Admission: RE | Admit: 2018-06-03 | Discharge: 2018-06-03 | Disposition: A | Discharge status: Home / Self Care | Payer: PRIVATE HEALTH INSURANCE | Source: Ambulatory Visit | Attending: Radiation Oncology | Admitting: Radiation Oncology

## 2018-06-03 ENCOUNTER — Telehealth: Payer: Self-pay | Admitting: *Deleted

## 2018-06-03 DIAGNOSIS — Z510 Encounter for antineoplastic radiation therapy: Secondary | ICD-10-CM | POA: Diagnosis not present

## 2018-06-03 NOTE — Telephone Encounter (Signed)
Per Doreatha Martin sch msg, added lab appointment to 06/16/18 for 9 am following RT.

## 2018-06-04 ENCOUNTER — Ambulatory Visit
Admission: RE | Admit: 2018-06-04 | Discharge: 2018-06-04 | Disposition: A | Discharge status: Home / Self Care | Payer: PRIVATE HEALTH INSURANCE | Source: Ambulatory Visit | Attending: Radiation Oncology | Admitting: Radiation Oncology

## 2018-06-04 DIAGNOSIS — Z510 Encounter for antineoplastic radiation therapy: Secondary | ICD-10-CM | POA: Diagnosis not present

## 2018-06-05 ENCOUNTER — Ambulatory Visit
Admission: RE | Admit: 2018-06-05 | Discharge: 2018-06-05 | Disposition: A | Discharge status: Home / Self Care | Payer: PRIVATE HEALTH INSURANCE | Source: Ambulatory Visit | Attending: Radiation Oncology | Admitting: Radiation Oncology

## 2018-06-05 DIAGNOSIS — Z510 Encounter for antineoplastic radiation therapy: Secondary | ICD-10-CM | POA: Diagnosis not present

## 2018-06-06 ENCOUNTER — Ambulatory Visit
Admission: RE | Admit: 2018-06-06 | Discharge: 2018-06-06 | Disposition: A | Discharge status: Home / Self Care | Payer: PRIVATE HEALTH INSURANCE | Source: Ambulatory Visit | Attending: Radiation Oncology | Admitting: Radiation Oncology

## 2018-06-06 ENCOUNTER — Encounter: Payer: Self-pay | Admitting: *Deleted

## 2018-06-06 ENCOUNTER — Other Ambulatory Visit: Payer: Self-pay | Admitting: *Deleted

## 2018-06-06 ENCOUNTER — Inpatient Hospital Stay: Payer: PRIVATE HEALTH INSURANCE

## 2018-06-06 DIAGNOSIS — C712 Malignant neoplasm of temporal lobe: Secondary | ICD-10-CM

## 2018-06-06 DIAGNOSIS — Z510 Encounter for antineoplastic radiation therapy: Secondary | ICD-10-CM | POA: Diagnosis not present

## 2018-06-06 LAB — RESEARCH LABS

## 2018-06-06 NOTE — Progress Notes (Signed)
[id number]:  Patient provided with an updated copy of his appointment calendar.  Patient had lab work drawn for study and confirms appointment for Monday.  Patient reports doing well and he is accompanied by his wife.  Doreatha Martin, RN, BSN, [facility] 06/06/2018 8:49 AM

## 2018-06-09 ENCOUNTER — Inpatient Hospital Stay: Payer: PRIVATE HEALTH INSURANCE | Attending: Internal Medicine | Admitting: *Deleted

## 2018-06-09 ENCOUNTER — Ambulatory Visit
Admission: RE | Admit: 2018-06-09 | Discharge: 2018-06-09 | Disposition: A | Discharge status: Home / Self Care | Payer: PRIVATE HEALTH INSURANCE | Source: Ambulatory Visit | Attending: Radiation Oncology | Admitting: Radiation Oncology

## 2018-06-09 VITALS — BP 117/84 | HR 74

## 2018-06-09 DIAGNOSIS — Z923 Personal history of irradiation: Secondary | ICD-10-CM | POA: Insufficient documentation

## 2018-06-09 DIAGNOSIS — Z510 Encounter for antineoplastic radiation therapy: Secondary | ICD-10-CM | POA: Insufficient documentation

## 2018-06-09 DIAGNOSIS — C712 Malignant neoplasm of temporal lobe: Secondary | ICD-10-CM | POA: Insufficient documentation

## 2018-06-09 DIAGNOSIS — R5383 Other fatigue: Secondary | ICD-10-CM | POA: Insufficient documentation

## 2018-06-09 DIAGNOSIS — I10 Essential (primary) hypertension: Secondary | ICD-10-CM | POA: Insufficient documentation

## 2018-06-09 DIAGNOSIS — Z87442 Personal history of urinary calculi: Secondary | ICD-10-CM | POA: Insufficient documentation

## 2018-06-09 DIAGNOSIS — Z9221 Personal history of antineoplastic chemotherapy: Secondary | ICD-10-CM | POA: Insufficient documentation

## 2018-06-09 DIAGNOSIS — Z87891 Personal history of nicotine dependence: Secondary | ICD-10-CM | POA: Insufficient documentation

## 2018-06-09 DIAGNOSIS — G4700 Insomnia, unspecified: Secondary | ICD-10-CM | POA: Insufficient documentation

## 2018-06-09 DIAGNOSIS — Z9889 Other specified postprocedural states: Secondary | ICD-10-CM | POA: Insufficient documentation

## 2018-06-09 DIAGNOSIS — M129 Arthropathy, unspecified: Secondary | ICD-10-CM | POA: Insufficient documentation

## 2018-06-09 DIAGNOSIS — F418 Other specified anxiety disorders: Secondary | ICD-10-CM | POA: Insufficient documentation

## 2018-06-09 DIAGNOSIS — R51 Headache: Secondary | ICD-10-CM | POA: Insufficient documentation

## 2018-06-09 DIAGNOSIS — K760 Fatty (change of) liver, not elsewhere classified: Secondary | ICD-10-CM | POA: Insufficient documentation

## 2018-06-09 NOTE — Progress Notes (Signed)
[id number]: Week 2 Titration visit, Week 3 Day 1  06/06/18:  Labs: Blood specimen collected Friday, 06/06/18.  Results from [facility].  Potassium remains <5.0 mmol/L; Creatinine has not increased by 20% and GFR and has decreased by 20%.  Lab results are adequate to increase Ramipril dose per protocol.  During the visit today, this RN reviewed the lab results with the patient and provided a patient with a copy for his records. Results reviewed by Dr. Mickeal Skinner immediately following the patient's visit on 06/09/18.  06/09/18 8:35AM  [name] "Barry Rios" is present today with his son for his scheduled research visit.    Vital Signs:  Patient's vital signs were collected and recorded by this RN.  The patient's blood pressure is adequate to increase Ramipril dose per protocol.  Adverse Events:  When asked if the patient had experienced any changes to his health and medications since the last visit the patient reports the following:  Fatigue - Barry Rios mentions that his fatigue is worsening slightly.  He is continuing to take daily naps, two hours each day, in addition to sleeping 7 hours each night.  The patient has no trouble falling asleep or staying asleep.  While this is worsening for the patient, it remains at a Grade 1 AE, because the fatigue is relieved by rest and is not limiting the patient's ADLs.  Headache - The patient also mentions that his headache is worsened from last week.  The patient reports that the headache worsens with any quick movement of his head.  The headache is a dull ache at the site of radiation therapy and is 3/10 on the pain scale.  The patient is continuing to take acetaminophen and naproxen sodium PRN.  While this is worsening for the patient, the AE remain a Grade 1, because the headache is not limiting the patient's ADLs.  The patient reports a new Nausea. The nausea was first noted by the patient on 06/06/18.  At the onset of nausea, the patient began taking prescribed  ondanestron with relief in the nausea.  The patient is taking the ondansetron about once daily with continued relief in nausea.  URI continues to improve but is ongoing.  The patient is now only experiencing an occasional cough.  Blurred Vision is unchanged.  Patient does not report any intolerable or Grade 3 adverse events; able to increase Ramipril dose per protocol.  Medications:    New use of ondansetron, as outlined above.  Patient's son, Dorothea Ogle, asks if it is okay for the patient to take Olive Leaf Extract.  Dorothea Ogle had recently heard from a friend that taking Olive Leaf Extract can be good for overall health but may cause thinning of the blood.  The patient has not taken any olive leaf extract, and is uncertain if he is interested in taking.  This RN stated uncertainty if olive leaf extract could or could not be taken with the patient's investigational Ramipril and chemotherapy.  This RN stated that she would confer with Dr. Mickeal Skinner and call the patient only if the patient should NOT take olive leaf extract.  Following the visit, this RN spoke with Dr. Mickeal Skinner who confirmed the patient could take olive leaf extract if desired.  Investigational Medication - At the start of the appointment, the patient provided this RN with his pill bottle of investigational Ramipril.  While the patient was in an exam room and allowed to rest, this RN took the medication to the [facility] to be counted.  Pill count was 112.  Patient has been 100% compliant with investigational medication.  The pill bottle was returned to this RN, who then redispensed the same pill bottle to the patient for Week 3.  The patient's old pill diary was reviewed with the patient and then signed by the patient.  The patient did not make any comments on the medication diary.    Increase in daily dose to 5 mg Ramipril daily.  Okay to increase Ramipril dose, based off of current lab results, blood pressure measurement and patient reported  AEs.  The patient is now to begin taking 4 tabs of Ramipril each night, beginning tonight, 06/09/18, and the patient verbalized an understanding of this.  An updated Administration schedule for Study Medication for Week 3 was provided to the patient in addition to a July Medication Diary.  The patient denies any questions or concerns.    Adverse Event Table: Adverse Event Grade (CTCAE v5.0) Start Date End Date Relatedness (to Ramipril) Comments  Hepatobiliary disorder, other: Fatty liver 1 Baseline ongoing NA; prior to study initiation Medical history, no interventions. AST and ALT slightly elevated at Baseline.  Alanine aminotransferase increased 1 Baseline ongoing NA; prior to study initiation Baseline ALT 68 U/L  Aspartate aminotransferase increased 1 Baseline Resolved 05/30/18 NA; prior to study initiation Baseline AST 37 U/L; Follow up AST 34 IU/L on 05/30/18  Blurred Visition 1 Baseline ongoing NA; prior to study initiation Medical History  Arthritis 1 Baseline ongoing NA; prior to study initiation Medical History; taking PRN naproxen sodium  Dizziness 1 05/26/18 05/26/18 Unrelated Lasting less than 1 minute immediately following first RT treatment; self resolved  Upper Respiratory Infection 2 05/28/18 ongoing Unrelated Congestion, postnasal drip with sore throat and cough.  Nasal saline spray PRN.  Headache 1 05/28/18 ongoing Unrelated Taking acetaminophen and naproxen sodium PRN.  Dull ache.   06/09/18 - worsened to 3/10 on pain scale; remains Grade 1.  Fatigue 1 05/28/18 ongoing Unrelated Relived by rest; daily 1-2 hour long nap.  Nighttime sleep has not increased. 06/09/18 - requiring 2 hr daily naps, remains Grade 1.  Nausea 1 06/06/18 ongoing Unrelated Relieved with PRN ondansetron.   Patient understands to contact this RN or Dr. Mickeal Skinner with any questions or concerns.  Upcoming lab appointment for Week 3 Titration was confirmed with the patient and Week 4 Titration labs and visit were  tentatively scheduled. Patient was reminded of his routine visit with Dr. Mickeal Skinner, scheduled for Wednesday 06/11/18.  This RN thanked the patient for his ongoing participation in research and for his efforts during this study.  Doreatha Martin, RN, BSN, [facility] 06/09/2018 10:15 AM

## 2018-06-10 ENCOUNTER — Ambulatory Visit
Admission: RE | Admit: 2018-06-10 | Discharge: 2018-06-10 | Disposition: A | Discharge status: Home / Self Care | Payer: PRIVATE HEALTH INSURANCE | Source: Ambulatory Visit | Attending: Radiation Oncology | Admitting: Radiation Oncology

## 2018-06-10 DIAGNOSIS — C712 Malignant neoplasm of temporal lobe: Secondary | ICD-10-CM | POA: Diagnosis not present

## 2018-06-11 ENCOUNTER — Inpatient Hospital Stay: Payer: PRIVATE HEALTH INSURANCE

## 2018-06-11 ENCOUNTER — Ambulatory Visit
Admission: RE | Admit: 2018-06-11 | Discharge: 2018-06-11 | Disposition: A | Discharge status: Home / Self Care | Payer: PRIVATE HEALTH INSURANCE | Source: Ambulatory Visit | Attending: Radiation Oncology | Admitting: Radiation Oncology

## 2018-06-11 ENCOUNTER — Inpatient Hospital Stay (HOSPITAL_BASED_OUTPATIENT_CLINIC_OR_DEPARTMENT_OTHER): Payer: PRIVATE HEALTH INSURANCE | Admitting: Internal Medicine

## 2018-06-11 ENCOUNTER — Telehealth: Payer: Self-pay | Admitting: Internal Medicine

## 2018-06-11 VITALS — BP 115/71 | HR 66 | Temp 98.4°F | Resp 18 | Ht 71.25 in | Wt 251.5 lb

## 2018-06-11 DIAGNOSIS — Z87442 Personal history of urinary calculi: Secondary | ICD-10-CM

## 2018-06-11 DIAGNOSIS — Z87891 Personal history of nicotine dependence: Secondary | ICD-10-CM | POA: Diagnosis not present

## 2018-06-11 DIAGNOSIS — C712 Malignant neoplasm of temporal lobe: Secondary | ICD-10-CM | POA: Diagnosis not present

## 2018-06-11 DIAGNOSIS — M129 Arthropathy, unspecified: Secondary | ICD-10-CM | POA: Diagnosis not present

## 2018-06-11 DIAGNOSIS — G4700 Insomnia, unspecified: Secondary | ICD-10-CM | POA: Diagnosis not present

## 2018-06-11 DIAGNOSIS — F418 Other specified anxiety disorders: Secondary | ICD-10-CM

## 2018-06-11 DIAGNOSIS — Z923 Personal history of irradiation: Secondary | ICD-10-CM | POA: Diagnosis not present

## 2018-06-11 DIAGNOSIS — R51 Headache: Secondary | ICD-10-CM | POA: Diagnosis not present

## 2018-06-11 DIAGNOSIS — R5383 Other fatigue: Secondary | ICD-10-CM

## 2018-06-11 DIAGNOSIS — I10 Essential (primary) hypertension: Secondary | ICD-10-CM | POA: Diagnosis not present

## 2018-06-11 DIAGNOSIS — K760 Fatty (change of) liver, not elsewhere classified: Secondary | ICD-10-CM

## 2018-06-11 LAB — CBC WITH DIFFERENTIAL (CANCER CENTER ONLY)
Basophils Absolute: 0.1 10*3/uL (ref 0.0–0.1)
Basophils Relative: 1 %
EOS PCT: 3 %
Eosinophils Absolute: 0.2 10*3/uL (ref 0.0–0.5)
HCT: 42.9 % (ref 38.4–49.9)
Hemoglobin: 14.6 g/dL (ref 13.0–17.1)
LYMPHS ABS: 1.3 10*3/uL (ref 0.9–3.3)
LYMPHS PCT: 18 %
MCH: 31.2 pg (ref 27.2–33.4)
MCHC: 34.1 g/dL (ref 32.0–36.0)
MCV: 91.6 fL (ref 79.3–98.0)
MONO ABS: 0.6 10*3/uL (ref 0.1–0.9)
Monocytes Relative: 9 %
Neutro Abs: 5 10*3/uL (ref 1.5–6.5)
Neutrophils Relative %: 69 %
PLATELETS: 524 10*3/uL — AB (ref 140–400)
RBC: 4.68 MIL/uL (ref 4.20–5.82)
RDW: 14.8 % — AB (ref 11.0–14.6)
WBC Count: 7.2 10*3/uL (ref 4.0–10.3)

## 2018-06-11 LAB — CMP (CANCER CENTER ONLY)
ALT: 56 U/L — AB (ref 0–44)
AST: 32 U/L (ref 15–41)
Albumin: 4.3 g/dL (ref 3.5–5.0)
Alkaline Phosphatase: 56 U/L (ref 38–126)
Anion gap: 5 (ref 5–15)
BILIRUBIN TOTAL: 0.5 mg/dL (ref 0.3–1.2)
BUN: 13 mg/dL (ref 6–20)
CHLORIDE: 103 mmol/L (ref 98–111)
CO2: 31 mmol/L (ref 22–32)
CREATININE: 1.12 mg/dL (ref 0.61–1.24)
Calcium: 9.9 mg/dL (ref 8.9–10.3)
Glucose, Bld: 96 mg/dL (ref 70–99)
POTASSIUM: 4.5 mmol/L (ref 3.5–5.1)
Sodium: 139 mmol/L (ref 135–145)
TOTAL PROTEIN: 6.7 g/dL (ref 6.5–8.1)
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
## 2018-06-11 NOTE — Telephone Encounter (Signed)
Scheduled appt per 7/3 los - gave patient aVS and calender per los.  

## 2018-06-13 ENCOUNTER — Ambulatory Visit
Admission: RE | Admit: 2018-06-13 | Discharge: 2018-06-13 | Disposition: A | Discharge status: Home / Self Care | Payer: PRIVATE HEALTH INSURANCE | Source: Ambulatory Visit | Attending: Radiation Oncology | Admitting: Radiation Oncology

## 2018-06-13 ENCOUNTER — Other Ambulatory Visit: Payer: PRIVATE HEALTH INSURANCE

## 2018-06-13 DIAGNOSIS — C712 Malignant neoplasm of temporal lobe: Secondary | ICD-10-CM | POA: Diagnosis not present

## 2018-06-16 ENCOUNTER — Encounter: Payer: Self-pay | Admitting: Internal Medicine

## 2018-06-16 ENCOUNTER — Inpatient Hospital Stay: Payer: PRIVATE HEALTH INSURANCE

## 2018-06-16 ENCOUNTER — Encounter: Payer: Self-pay | Admitting: *Deleted

## 2018-06-16 ENCOUNTER — Ambulatory Visit
Admission: RE | Admit: 2018-06-16 | Discharge: 2018-06-16 | Disposition: A | Discharge status: Home / Self Care | Payer: PRIVATE HEALTH INSURANCE | Source: Ambulatory Visit | Attending: Radiation Oncology | Admitting: Radiation Oncology

## 2018-06-16 DIAGNOSIS — C712 Malignant neoplasm of temporal lobe: Secondary | ICD-10-CM

## 2018-06-16 LAB — RESEARCH LABS

## 2018-06-17 ENCOUNTER — Ambulatory Visit
Admission: RE | Admit: 2018-06-17 | Discharge: 2018-06-17 | Disposition: A | Discharge status: Home / Self Care | Payer: PRIVATE HEALTH INSURANCE | Source: Ambulatory Visit | Attending: Radiation Oncology | Admitting: Radiation Oncology

## 2018-06-17 ENCOUNTER — Telehealth: Payer: Self-pay | Admitting: *Deleted

## 2018-06-17 ENCOUNTER — Inpatient Hospital Stay: Payer: PRIVATE HEALTH INSURANCE | Admitting: *Deleted

## 2018-06-17 ENCOUNTER — Other Ambulatory Visit: Payer: Self-pay | Admitting: *Deleted

## 2018-06-17 VITALS — BP 108/82 | HR 71

## 2018-06-17 DIAGNOSIS — C712 Malignant neoplasm of temporal lobe: Secondary | ICD-10-CM | POA: Diagnosis not present

## 2018-06-17 NOTE — Telephone Encounter (Addendum)
[id number]:  This RN contacted [facility] for an update on patient [id number]'s lab results, labs drawn 06/16/18.  The customer service representative confirmed that the specimen had been received but not processed, and would not be processed until overnight with lab results being faxed to the site in the morning of 06/18/18.  This RN looked at prior lab reports and noted that both of these lab reports were faxed at 08:07am.  Dr. Mickeal Skinner updated and confirmed patient to continue on 5mg  dose.  With patient's early RT on 06/18/18; this RN will not have time to receive, review and copy labs in time to meet with patient prior to his departure on 06/18/18.  This RN confirmed with Remer Macho, Research Specialist that the patient's blood specimen was collected by [facility] on 06/16/18.  Vicente Males confirmed that the specimen was picked up on 06/16/18, prior to the last 5:00pm [facility] pick up.  It is unconfirmed if the specimen was picked up at the morning pickup or early afternoon pick up on 06/16/18.    _____  06/17/18 3:40 PM  This RN called [name] to update him regarding lab results not yet received.  This RN informed the patient the blood sample was received but not yet resulted and results would not be in until tomorrow morning, likely after his treatment.  This RN instructed the patient to continue on 5mg  dosing and that the patient would be contacted tomorrow after lab results were received.  Patient is agreeable to, verbalized an understanding of, and expressed thanks for the update.  Doreatha Martin, RN, BSN, [facility] 06/17/2018 3:44 PM

## 2018-06-17 NOTE — Progress Notes (Signed)
WF 1801: Week 3 Titration Visit, Week 4 Day 1  06/16/18:  Labs: Blood specimen collected Monday, 06/16/18.  Results from [place] have not yet been received as of the time of this visit.  Patient was instructed to continue dosing at 5mg , 4 capsules each night, and that this RN would provide him with lab results once those have been received.  This RN has planned to tentatively meet with the patient after his Radiation Treatment on Wednesday, 06/18/18.  An error was noted in prior notes regarding GFR.  The patient's GFR has NOT dropped by 20% at any visit to date.  06/17/18 8:30AM  [name] is present today with his wife for his scheduled research visit.  Vital Signs: The patient's blood pressure and heart rate were measured and recorded by this RN.  Measurements were taken after the patient had been seated for 5 minutes.  The patient's blood pressure is adequate to maintain Ramipril dose per protocol.  The patient mentions that his blood pressure "has never been this low before."  The patient states that he feels well and has not noticed any impact from his blood pressure being lower than his usual.  This RN reassured the patient that his blood pressure measurement today was an appropriate measurement and that his blood pressure would be checked again next week.  Patient's usual blood pressure is between 081-448 systolic.  Adverse Events:  When asked if the patient has experienced any changes to his health or medications since the last visit the patient reports the following:  Alopecia - [name] mentions, "my hair is starting to fall out."  His wife mentions that his hair is falling out in mainly two "spots."  Patient is wearing a ball cap, but patient wears a ball cap daily and this is not a change. Patient first noted hair loss last Wednesday, 06/11/18.  Headache - The patient reports some improvement in his headache.  He has increased his water intake and so has been able to reduce his  use of acetaminophen and naproxen sodium.  The patient continues to take both PRN.  The headache is now back to a 2/10 on the pain scale.  Fatigue - The patient reports his fatigue is ongoing; "it's the same."  Nausea - The patient reports his nausea symptoms have been well controlled with the use of PRN ondansetron.  The patient continues to take this once daily with complete relief in nausea.    URI - patient continues to have a lingering cough that is mild and intermittent.  No other URI symptoms remain.  The patient is hopeful that he will be able to return to work in August.  Patient does not report any intolerable or Grade 3 adverse events; able to increase Ramipril dose per protocol.  Medications: The patient has not had any changes to his medications.  Investigational Medication - At the start of the appointment, the patient provided this RN with his medication bottle of investigational Ramipril and his medication diary.  The patient has completed that he took 4 capsules each night since his last visit on the medication diary, but the patient states, "I think I only took 3 capsules on Saturday night; I'm not sure."  The medication diary was signed by the patient.  While the patient was in an exam room, this RN took the medication to the [place] to be counted.  Capsule count was 81; expected count to be 80 capsules remaining if patient had taken  4 caps each night since his last visit.  Patient has been 97% compliant with investigational medication.  The medication was returned to this RN by Barnett Applebaum in Pharmacy.  This RN then re-dispensed the same medication bottle to the patient for Week 4.  The patient's July medication diary was also returned to the patient after this RN made a copy for research records.  The patient did not make any comments on the medication diary.  The patient was reminded to take four capsules each night and the patient verbalized an understanding.  Maintain daily  dose of 5mg  Ramipril daily.  Toxicities that were able to be assessed at the time of the visit (blood pressure and adverse events) are appropriate to continue the patient's daily dose at 5mg , 4 capsules.  The patient recently had local, routine lab work in which the potassium was less than 5.0 mmol/L; creatinine had not increased by 20% from baseline and GFR had not decreased by 20%.  Based upon this information, this RN instructed the patient to continue on the 5mg  daily dose and that this RN would notify the patient once study required lab work results were received.  At this time, this RN tentatively will meet the patient 06/18/18 to provide him with a copy of the lab results and Administration Schedule to confirm Week 4 dosing.  This RN to call the patient if any changes to this tentative plan are required.  The patient verbalizes an understanding and is in agreement with this plan.  The patient denies any questions or concerns.  Adverse Event Table: Adverse Event Grade (CTCAE v5.0) Start Date End Date Relatedness (to Ramipril) Comments  Hepatobiliary disorder, other: Fatty liver 1 Baseline ongoing NA; prior to study initiation Medical history, no interventions. AST and ALT slightly elevated at Baseline.  Alanine aminotransferase increased 1 Baseline ongoing NA; prior to study initiation Baseline ALT 68 U/L  Aspartate aminotransferase increased 1 Baseline Resolved 05/30/18 NA; prior to study initiation Baseline AST 37 U/L; Follow up AST 34 IU/L on 05/30/18  Blurred Visition 1 Baseline ongoing NA; prior to study initiation Medical History  Arthritis 1 Baseline ongoing NA; prior to study initiation Medical History; taking PRNnaproxen sodium  Dizziness 1 05/26/18 05/26/18 Unrelated Lasting less than 1 minute immediately following first RT treatment; self resolved  Upper Respiratory Infection 2 05/28/18 ongoing Unrelated Congestion, postnasal drip with sore throat and cough. Nasal saline spray PRN. 7/9-  Only mild intermittent cough remains.  Headache 1 05/28/18 ongoing Unrelated Taking acetaminophen and naproxen sodium PRN.  Dull ache.  06/17/18 - improved to 2/10, continues use of PRN medication.  Fatigue 1 05/28/18 ongoing Unrelated Relived by rest; daily 1-2 hour long nap. Nighttime sleep has not increased.  Nausea 1 06/06/18 ongoing Unrelated Relieved with PRN ondansetron.  Alopecia 1 06/11/18 ongoing Unrelated Two "spots" of hair loss   This RN and patient discussed his next visit.  Based upon the study required lab work taking longer to receive results than anticipated, the patient's next study lab draw will continue to be as planned on Monday 06/23/18, but the visit will occur on Wednesday 06/25/18 in order to allow enough time for the results to be sent back  The patient is in agreement with this plan. The patient and his wife were thanked for their time and flexibility.    Doreatha Martin, RN, BSN, [facility] 06/17/2018 10:00 AM

## 2018-06-18 ENCOUNTER — Ambulatory Visit
Admission: RE | Admit: 2018-06-18 | Discharge: 2018-06-18 | Disposition: A | Discharge status: Home / Self Care | Payer: PRIVATE HEALTH INSURANCE | Source: Ambulatory Visit | Attending: Radiation Oncology | Admitting: Radiation Oncology

## 2018-06-18 ENCOUNTER — Telehealth: Payer: Self-pay | Admitting: *Deleted

## 2018-06-18 DIAGNOSIS — C712 Malignant neoplasm of temporal lobe: Secondary | ICD-10-CM | POA: Diagnosis not present

## 2018-06-18 NOTE — Progress Notes (Addendum)
[id number]: Week 4  06/18/18 Patient [id number] [name] lab results were received morning of 06/18/18.  The patient's Creatinine has increased by 24% from baseline and protocol requires that the patient's investigational Ramipril dose be reduced to maximum tolerated dose of 2.5mg .  This RN contacted the patient via phone and updated the patient.  Patient verbalized and understanding of situation and how to take investigational medication moving forward.  Please see Telephone Encounter for documentation of this communication with patient.  Patient's first day of reduced dose will be 06/18/18; this is within the protocol define visit window for Week 3 Titration visit.  Doreatha Martin, RN, BSN, [facility] 06/18/2018 4:52 PM

## 2018-06-18 NOTE — Telephone Encounter (Signed)
[id number]:  06/18/18   1:18 PM This RN left voicemail for patient requesting call back to go over lab results as soon as possible. Contact number given.  2:43 PM Again no answer.  3:24 PM This RN left voicemail for patient requesting call back as soon as possible because dose of Ramipril needs to be modified based off of lab results.  Contact number given.  4:35 PM This RN spoke with patient over the phone and instructed him to reduce his Ramipril dose to 2.5mg , 2 capsules, each night.  Explained that his Creatinine level has been slowly increasing during study participation and has increased by more than 20% so he should continue on this reduced dose.  Explained that his Creatinine level was still within normal limits.  Patient verbalized an understanding and repeated that he would take 2 capsules at night.  This RN to meet with patient tomorrow morning to provide him with a copy of the lab results and new Week 4 Administration Schedule.    Doreatha Martin, RN, BSN, [facility] 06/18/2018 4:38 PM

## 2018-06-19 ENCOUNTER — Ambulatory Visit
Admission: RE | Admit: 2018-06-19 | Discharge: 2018-06-19 | Disposition: A | Discharge status: Home / Self Care | Payer: PRIVATE HEALTH INSURANCE | Source: Ambulatory Visit | Attending: Radiation Oncology | Admitting: Radiation Oncology

## 2018-06-19 ENCOUNTER — Encounter: Payer: Self-pay | Admitting: *Deleted

## 2018-06-19 ENCOUNTER — Encounter: Payer: Self-pay | Admitting: Internal Medicine

## 2018-06-19 DIAGNOSIS — C712 Malignant neoplasm of temporal lobe: Secondary | ICD-10-CM

## 2018-06-19 NOTE — Progress Notes (Signed)
[id number]:  Week 3 Titration  06/19/18 8:15AM  This RN met patient and his spouse and provided the patient with the Week 4 and onward Administration Schedule.  The patient is to take 2 capsules QHS; 2.13m.  The patient verbalized an understanding.  The patient was provided with a copy of most recent lab results and these were reviewed with the patient.  Patient denies having any questions or concerns.  Start date of 06/18/18 for Week 4 is within study defined protocol window.  Patient's radiation treatment was pushed back one day due to no treatments given on holidays (July 4th), making Tuesday 06/17/18 Day 1 of Week 4.  This is a protocol defined adjustment for delays in RT.  The protocol defined window for Week 3 titration visit, completed at end of Week 3 for dosing starting day 1 of Week 4, is +/-3 days.  Visit window for visit: 06/14/18-06/20/18.  SDoreatha Martin RN, BSN, [facility]7/10/2018 9:01 AM

## 2018-06-20 ENCOUNTER — Ambulatory Visit
Admission: RE | Admit: 2018-06-20 | Discharge: 2018-06-20 | Disposition: A | Discharge status: Home / Self Care | Payer: PRIVATE HEALTH INSURANCE | Source: Ambulatory Visit | Attending: Radiation Oncology | Admitting: Radiation Oncology

## 2018-06-20 DIAGNOSIS — C712 Malignant neoplasm of temporal lobe: Secondary | ICD-10-CM

## 2018-06-20 MED ORDER — SONAFINE EX EMUL
1.0000 "application " | Freq: Once | CUTANEOUS | Status: AC
  Administered 2018-06-20: 1 via TOPICAL

## 2018-06-23 ENCOUNTER — Inpatient Hospital Stay: Payer: PRIVATE HEALTH INSURANCE

## 2018-06-23 ENCOUNTER — Ambulatory Visit
Admission: RE | Admit: 2018-06-23 | Discharge: 2018-06-23 | Disposition: A | Discharge status: Home / Self Care | Payer: PRIVATE HEALTH INSURANCE | Source: Ambulatory Visit | Attending: Radiation Oncology | Admitting: Radiation Oncology

## 2018-06-23 ENCOUNTER — Other Ambulatory Visit: Payer: Self-pay | Admitting: *Deleted

## 2018-06-23 ENCOUNTER — Encounter: Payer: Self-pay | Admitting: Internal Medicine

## 2018-06-23 DIAGNOSIS — C712 Malignant neoplasm of temporal lobe: Secondary | ICD-10-CM

## 2018-06-23 LAB — CMP (CANCER CENTER ONLY)
ALBUMIN: 4.1 g/dL (ref 3.5–5.0)
ALT: 47 U/L — ABNORMAL HIGH (ref 0–44)
ANION GAP: 7 (ref 5–15)
AST: 29 U/L (ref 15–41)
Alkaline Phosphatase: 48 U/L (ref 38–126)
BILIRUBIN TOTAL: 0.5 mg/dL (ref 0.3–1.2)
BUN: 9 mg/dL (ref 6–20)
CO2: 30 mmol/L (ref 22–32)
Calcium: 9.5 mg/dL (ref 8.9–10.3)
Chloride: 104 mmol/L (ref 98–111)
Creatinine: 1.04 mg/dL (ref 0.61–1.24)
GFR, Est AFR Am: >60 mL/min (ref >60)
GLUCOSE: 110 mg/dL — AB (ref 70–99)
Potassium: 4.3 mmol/L (ref 3.5–5.1)
Sodium: 141 mmol/L (ref 135–145)
TOTAL PROTEIN: 6.4 g/dL — AB (ref 6.5–8.1)

## 2018-06-23 LAB — CBC WITH DIFFERENTIAL (CANCER CENTER ONLY)
BASOS PCT: 1 %
Basophils Absolute: 0.1 10*3/uL (ref 0.0–0.1)
Eosinophils Absolute: 0.4 10*3/uL (ref 0.0–0.5)
Eosinophils Relative: 7 %
HEMATOCRIT: 42.7 % (ref 38.4–49.9)
HEMOGLOBIN: 14.7 g/dL (ref 13.0–17.1)
LYMPHS ABS: 0.9 10*3/uL (ref 0.9–3.3)
LYMPHS PCT: 16 %
MCH: 31.6 pg (ref 27.2–33.4)
MCHC: 34.4 g/dL (ref 32.0–36.0)
MCV: 92 fL (ref 79.3–98.0)
MONO ABS: 0.5 10*3/uL (ref 0.1–0.9)
MONOS PCT: 8 %
NEUTROS PCT: 68 %
Neutro Abs: 4 10*3/uL (ref 1.5–6.5)
Platelet Count: 502 10*3/uL — ABNORMAL HIGH (ref 140–400)
RBC: 4.64 MIL/uL (ref 4.20–5.82)
RDW: 14.8 % — ABNORMAL HIGH (ref 11.0–14.6)
WBC Count: 5.9 10*3/uL (ref 4.0–10.3)

## 2018-06-23 LAB — RESEARCH LABS

## 2018-06-23 MED ORDER — INV-RAMIPRIL 1.25 MG CAP WF-1801 (#133): 2.5000 mg | ORAL_CAPSULE | Freq: Every day | ORAL | 0 refills | Status: DC

## 2018-06-23 NOTE — Addendum Note (Signed)
Addended by: Doreatha Martin E on: 06/23/2018 04:08 PM   Modules accepted: Orders

## 2018-06-24 ENCOUNTER — Encounter: Payer: PRIVATE HEALTH INSURANCE | Admitting: *Deleted

## 2018-06-24 ENCOUNTER — Ambulatory Visit
Admission: RE | Admit: 2018-06-24 | Discharge: 2018-06-24 | Disposition: A | Discharge status: Home / Self Care | Payer: PRIVATE HEALTH INSURANCE | Source: Ambulatory Visit | Attending: Radiation Oncology | Admitting: Radiation Oncology

## 2018-06-24 DIAGNOSIS — C712 Malignant neoplasm of temporal lobe: Secondary | ICD-10-CM | POA: Diagnosis not present

## 2018-06-25 ENCOUNTER — Ambulatory Visit
Admission: RE | Admit: 2018-06-25 | Discharge: 2018-06-25 | Disposition: A | Discharge status: Home / Self Care | Payer: PRIVATE HEALTH INSURANCE | Source: Ambulatory Visit | Attending: Radiation Oncology | Admitting: Radiation Oncology

## 2018-06-25 ENCOUNTER — Encounter: Payer: Self-pay | Admitting: Internal Medicine

## 2018-06-25 ENCOUNTER — Encounter: Payer: Self-pay | Admitting: *Deleted

## 2018-06-25 ENCOUNTER — Inpatient Hospital Stay: Payer: PRIVATE HEALTH INSURANCE | Admitting: *Deleted

## 2018-06-25 DIAGNOSIS — C712 Malignant neoplasm of temporal lobe: Secondary | ICD-10-CM | POA: Diagnosis not present

## 2018-06-25 MED ORDER — INV-RAMIPRIL 1.25 MG CAP WF-1801 (#133): 2.5000 mg | ORAL_CAPSULE | Freq: Every day | ORAL | 0 refills | Status: DC

## 2018-06-25 NOTE — Progress Notes (Signed)
[id number]:  (End of) Week 4 Titration Visit, Week 5 Day 1 Visit  06/23/18:  Labs:  Blood specimen collected Monday, 06/23/18.  Results from [place] received 06/25/18.  Potassium remains <5.0 mmol/L; Creatinine has not increased by 20% and GFR and has not decreased by 20%. Creatinine has decreased and GFR increased since last labs.  Lab results are adequate to continue patient on maximum tolerated dose of 2.5mg  QHS.  During the visit today (06/25/18), this RN reviewed the lab results with the patient and provided the patient with a copy for his records.    06/25/18 8:48AM  [name] "Barry Rios" is present today with his daughter, [name], for his scheduled research visit.  This RN unable to "arrive" patient to RES visit scheduled in Epic, so RES visit was removed from patient's schedule and documentation completed in this Research Encounter of same date.  Vital Signs:  Patient's blood pressure and heart rate were collected and recorded by this RN.  The patient's blood pressure is adequate to maintain patient at maximum tolerated dose of 2.5mg .  Adverse Events:  When asked if the patient has experienced any changes to his health and medications since the last visit the patient reported the following:  Myalgia, bilateral leg ache - Patient describes experiencing a nightly leg pain, which feels like a dull ache.  2/10 on the pain scale.  Patient states that it is felt along his bilateral shins and is only felt at night when the patient lies down in bed.  The pain has fully resolved by the morning.  The patient has tried PRN tylenol with some relief, but states that increasing his water intake has provided the greatest relief.  The patient states that he began feeling this sensation about two weeks ago, approximately 06/11/18.    Fatigue - Unchanged.  Headache - Unchanged.  Nausea - Unchanged.  Alopecia - Slightly more hair loss per patient.  Hair loss visualized by this RN; less than 50% hair  loss.  URI - lingering cough unchanged.  Patient does not report any intolerable Grade 3 adverse events; able to maintain dose at maximum tolerated dose of 2.5mg  per protocol.  Medications:  The patient has not had any changes to his medications.  Patient continues to reports that he is taking temozolomide as prescribed without incidence.  Investigational Medication - At the start of the appointment, this patient provided this RN with his medication bottle of investigational Ramipril and his medication diary.  The medication diary was initialed by the patient.  While the patient was in the exam room, this RN took the medication to the [facility] to be counted and the pill count was 63.  Expected pill count was 63.  Patient was 100% compliant with dosing.  Patient's drug diary reflects the patient taking 2 capsules on Tuesday 06/17/18, however pill count does not reflect this (number of capsules remaining would have been 65 not 63) and patient was not instructed to take 2 capsules until 06/18/18.    Medication bottle was returned to this RN to who then returned the bottle to the patient.  A copy of the medication diary was also made and the original returned to the patient.  The patient verbalized an understanding of continuing nightly dosing at 2.5mg , 2 capsules.  Treatment plan was released on day visit (06/25/18), but Week 5 Day 1 was 06/24/18.  This visit on Day 2 of Week 5 falls within visit window for Week 4 Titration.  Adverse Event Table:  Adverse Event Grade (CTCAE v5.0) Start Date End Date Relatedness(to Ramipril) Comments  Hepatobiliary disorder, other: Fatty liver 1 Baseline ongoing NA; prior to study initiation Medical history, no interventions. AST and ALT slightly elevated at Baseline.  Alanine aminotransferase increased 1 Baseline ongoing NA; prior to study initiation Baseline ALT 68 U/Ll; Follow up ALT on 06/23/18 was within normal limits on [facility] results but still >ULN on local lab work drawn  same day so remains ongoing.  Aspartate aminotransferase increased 1 Baseline Resolved 05/30/18 NA; prior to study initiation Baseline AST 37 U/L; Follow up AST 34 IU/L on 05/30/18  Blurred Visition 1 Baseline ongoing NA; prior to study initiation Medical History  Arthritis 1 Baseline ongoing NA; prior to study initiation Medical History; taking PRNnaproxen sodium  Dizziness 1 05/26/18 05/26/18 Unrelated Lasting less than 1 minute immediately following first RT treatment; self resolved  Upper Respiratory Infection 2 05/28/18 ongoing Unrelated Congestion, postnasal drip with sore throat and cough. Nasal saline spray PRN.  Headache 1 05/28/18 ongoing Unrelated Taking acetaminophen and naproxen sodium PRN.Dull ache.   Fatigue 1 05/28/18 ongoing Unrelated Relived by rest; daily 1-2 hour long nap. Nighttime sleep has not increased.  Nausea 1 06/06/18 ongoing Unrelated Relieved with PRN ondansetron.  Alopecia 1 06/11/18 ongoing Unrelated Two "spots" of hair loss; less than 50% hair loss.  Myalgia 1 06/11/18 ongoing Unrelated Patient newly reports on 7/17 ache along shins that began approximately 7/3.  Felt only at night when lying in bed, gone in the mornings.    Visit for (End of) Week 5 titration and corresponding lab was confirmed.  Visit for (End of) Week 6 titration and corresponding lab was scheduled with patient.  Patient was thanked for his ongoing participation in the study.   Doreatha Martin, RN, BSN, [facility] 06/25/2018 10:48 AM

## 2018-06-26 ENCOUNTER — Ambulatory Visit
Admission: RE | Admit: 2018-06-26 | Discharge: 2018-06-26 | Disposition: A | Discharge status: Home / Self Care | Payer: PRIVATE HEALTH INSURANCE | Source: Ambulatory Visit | Attending: Radiation Oncology | Admitting: Radiation Oncology

## 2018-06-26 ENCOUNTER — Other Ambulatory Visit: Payer: PRIVATE HEALTH INSURANCE

## 2018-06-26 ENCOUNTER — Other Ambulatory Visit: Payer: Self-pay

## 2018-06-26 ENCOUNTER — Telehealth: Payer: Self-pay | Admitting: Internal Medicine

## 2018-06-26 ENCOUNTER — Encounter: Payer: Self-pay | Admitting: Internal Medicine

## 2018-06-26 ENCOUNTER — Inpatient Hospital Stay (HOSPITAL_BASED_OUTPATIENT_CLINIC_OR_DEPARTMENT_OTHER): Payer: PRIVATE HEALTH INSURANCE | Admitting: Internal Medicine

## 2018-06-26 VITALS — BP 121/78 | HR 64 | Temp 98.4°F | Resp 17 | Ht 71.25 in | Wt 254.6 lb

## 2018-06-26 DIAGNOSIS — R51 Headache: Secondary | ICD-10-CM | POA: Diagnosis not present

## 2018-06-26 DIAGNOSIS — G4700 Insomnia, unspecified: Secondary | ICD-10-CM | POA: Diagnosis not present

## 2018-06-26 DIAGNOSIS — Z87442 Personal history of urinary calculi: Secondary | ICD-10-CM

## 2018-06-26 DIAGNOSIS — K760 Fatty (change of) liver, not elsewhere classified: Secondary | ICD-10-CM

## 2018-06-26 DIAGNOSIS — R5383 Other fatigue: Secondary | ICD-10-CM | POA: Diagnosis not present

## 2018-06-26 DIAGNOSIS — Z87891 Personal history of nicotine dependence: Secondary | ICD-10-CM

## 2018-06-26 DIAGNOSIS — M129 Arthropathy, unspecified: Secondary | ICD-10-CM

## 2018-06-26 DIAGNOSIS — I10 Essential (primary) hypertension: Secondary | ICD-10-CM

## 2018-06-26 DIAGNOSIS — Z923 Personal history of irradiation: Secondary | ICD-10-CM

## 2018-06-26 DIAGNOSIS — F418 Other specified anxiety disorders: Secondary | ICD-10-CM

## 2018-06-26 DIAGNOSIS — C712 Malignant neoplasm of temporal lobe: Secondary | ICD-10-CM | POA: Diagnosis not present

## 2018-06-26 NOTE — Telephone Encounter (Signed)
Scheduled appt per 7/18 los - gave patient aVS and calender per los.
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
## 2018-06-27 ENCOUNTER — Telehealth: Payer: Self-pay | Admitting: *Deleted

## 2018-06-27 ENCOUNTER — Ambulatory Visit
Admission: RE | Admit: 2018-06-27 | Discharge: 2018-06-27 | Disposition: A | Discharge status: Home / Self Care | Payer: PRIVATE HEALTH INSURANCE | Source: Ambulatory Visit | Attending: Radiation Oncology | Admitting: Radiation Oncology

## 2018-06-27 DIAGNOSIS — C712 Malignant neoplasm of temporal lobe: Secondary | ICD-10-CM | POA: Diagnosis not present

## 2018-06-27 NOTE — Telephone Encounter (Signed)
[id number]:  06/27/18 1:14 PM Left voicemail for patient reminding him of lab appointment for research on Monday, Jul 22 at 8:45am and notified him of newly scheduled research appointment on Wednesday, Jul 31 at 9:30am.  Doreatha Martin, RN, BSN, [facility] 06/27/2018 1:15 PM

## 2018-06-30 ENCOUNTER — Ambulatory Visit: Payer: PRIVATE HEALTH INSURANCE | Admitting: Internal Medicine

## 2018-06-30 ENCOUNTER — Other Ambulatory Visit: Payer: Self-pay | Admitting: *Deleted

## 2018-06-30 ENCOUNTER — Ambulatory Visit
Admission: RE | Admit: 2018-06-30 | Discharge: 2018-06-30 | Disposition: A | Discharge status: Home / Self Care | Payer: PRIVATE HEALTH INSURANCE | Source: Ambulatory Visit | Attending: Radiation Oncology | Admitting: Radiation Oncology

## 2018-06-30 ENCOUNTER — Other Ambulatory Visit: Payer: PRIVATE HEALTH INSURANCE

## 2018-06-30 ENCOUNTER — Inpatient Hospital Stay: Payer: PRIVATE HEALTH INSURANCE

## 2018-06-30 DIAGNOSIS — C712 Malignant neoplasm of temporal lobe: Secondary | ICD-10-CM

## 2018-06-30 LAB — RESEARCH LABS

## 2018-07-01 ENCOUNTER — Ambulatory Visit
Admission: RE | Admit: 2018-07-01 | Discharge: 2018-07-01 | Disposition: A | Discharge status: Home / Self Care | Payer: PRIVATE HEALTH INSURANCE | Source: Ambulatory Visit | Attending: Radiation Oncology | Admitting: Radiation Oncology

## 2018-07-01 DIAGNOSIS — C712 Malignant neoplasm of temporal lobe: Secondary | ICD-10-CM | POA: Diagnosis not present

## 2018-07-02 ENCOUNTER — Ambulatory Visit
Admission: RE | Admit: 2018-07-02 | Discharge: 2018-07-02 | Disposition: A | Discharge status: Home / Self Care | Payer: PRIVATE HEALTH INSURANCE | Source: Ambulatory Visit | Attending: Radiation Oncology | Admitting: Radiation Oncology

## 2018-07-02 ENCOUNTER — Encounter: Payer: Self-pay | Admitting: *Deleted

## 2018-07-02 ENCOUNTER — Encounter: Payer: PRIVATE HEALTH INSURANCE | Admitting: *Deleted

## 2018-07-02 ENCOUNTER — Telehealth: Payer: Self-pay | Admitting: *Deleted

## 2018-07-02 DIAGNOSIS — C712 Malignant neoplasm of temporal lobe: Secondary | ICD-10-CM | POA: Diagnosis not present

## 2018-07-02 NOTE — Telephone Encounter (Signed)
[id number]:  07/02/18 11:41AM This RN called patient [name] and discussed the following:  - Let us know if the flank ache and burning with urination become persistent; at that time Dr. Mickeal Skinner will want to do a urinalysis.  Continue to stay well hydrated since that is resolving the symptoms at this time.  - Creatinine has again increased by greater than 20% so do not take any further investigational Ramipril.  Explained that the creatinine level is still within normal limits but has elevated enough to stop taking the medication.  This RN to meet patient tomorrow morning to pick up the investigational Ramipril from the patient. - ALT again slightly above normal range.  Patient acknowledged all, and verbalizes an understanding to take no further investigational ramipril.    Doreatha Martin, RN, BSN, [facility] 07/02/2018 1:27 PM

## 2018-07-02 NOTE — Telephone Encounter (Signed)
[id number] Follow Up:  07/02/18 5:03 PM This RN received clarification from [place] study team that patient is able to continue on investigational ramipril.  This RN called patient and left voicemail instructing him that clarification had been received and he CAN continue on investigational ramipril and he should take 2 capsules tonight. Also instructed the patient to still meet with RN in the morning to provide him a copy of the lab results, and perform drug count.    Doreatha Martin, RN, BSN, [facility] 07/02/2018 5:05 PM

## 2018-07-02 NOTE — Progress Notes (Signed)
[id number]:  (End of) Week 5 During RT Visit  06/30/18:  Blood specimen collected on Monday, 06/30/18.  Results from [facility] were not yet received at the time of the patient's visit on 07/02/18.  Following the visit this RN contacted [facility] contact at 9:29am to check on status of lab results and left voicemail requesting a return call regarding lab results for [id number], Patient [id number] DR.  Contact information provided for return call.  Response received following an email sent by this RN at 10:22am following patient's visit.  07/02/18 8:45 AM Patient Barry Barbara "[name]" is present today with his spouse, Barry Rios, for his scheduled research visit.  The patient's spouse waited in a waiting area instead of staying with the patient in the exam room.  This RN is again unable to "arrive" the patient to the "RES" visit in Epic, so the RES visit was removed from the patient's schedule and this Research Encounter used to document today's visit.  Vital Signs:  This RN measured and recorded the patient's blood pressure and heart rate with the patient in a seated position after five full minutes of rest.  The patient's blood pressure is adequate to maintain patient at maximum tolerated dose of 2.80m.  Adverse Events:  When asked if the patient has had any changes in his health or to his medications since his last visit, the patient reports the following:  Myalgia - cramping along the patient's bilateral shins is unchanged.  Flank Pain and Dysuria - Patient reports a new intermittent "dull ache" at the area of his right flank along with occasional burning with urination.  The patient first noticed this on Wednesday 06/25/18.  The patient states that the sensation is similar to the ache he felt after passing a kidney stone in the past.  Patient states that he has not felt like he has a kidney stone and at all, the ache is in the same area.  The pain is at a 1/10 on the pain scale at the worst.  The patient states that his  urine may be slightly darker, "like after drinking diet pepsi instead of water, so not dark."  Patient denies having any blood in urine or cloudy urine.  The right flank ache and burning with urination comes and goes.  The patient states that drinking water provides relief of both symptoms.  The patient says that when he gets up in the morning the symptoms are present and as the day progresses and the patient drinks more water (now drinking 6-7 "bottles" of water daily) the ache and burning with urination subside.  The patient attributes the symptoms to taking temodar each night.    Fatigue - slightly worsened with patient sleeping 10-12 hours daily (up from 9-10).  Still relieved with rest; remains at a grade 1.  Headache - patient states that he is no longer experiencing any headaches.  The patient last took a tylenol for headache pain on 06/27/18 and has not experienced another headache since.  Nausea - unchanged.  URI - unchanged.  Alopecia - unchanged.  Patient does not report any intolerable Grade 3 adverse events; able to maintain dose at maximum tolerated dose of 2.539mper protocol. Medications:  The patient has not had any changes to his medications.  The patient reports taking is temodar as prescribed.  The patient is looking forward to an upcoming break following completion of Radiation Therapy.  - Investigational Medication - The patient did not bring his study medication with his to the  visit today.  The patient did bring his medication diary, initialed for this week, and this RN made a copy of the diary before returning it to the patient.  The patient reports taking 2 capsules every night as directed.  At the time of the visit, lab results are unavailable to assess all toxicities per protocol.  This RN instructed the patient to continue nightly dosing at 2.41m, 2 capsules, and that this RN would contact him if lab results indicated the medication needs to be stopped.  This RN to provide  patient a copy of the lab results at an upcoming RT visit once lab results are available.  Adverse Event Table: Adverse Event Grade (CTCAE v5.0) Start Date End Date Relatedness(to Ramipril) Comments  Hepatobiliary disorder, other: Fatty liver 1 Baseline ongoing NA; prior to study initiation Medical history, no interventions. AST and ALT slightly elevated at Baseline.  Alanine aminotransferase increased 1 Baseline ongoing NA; prior to study initiation Baseline ALT 68 U/L;  06/30/18 ALT 47 IU/L  Aspartate aminotransferase increased 1 Baseline Resolved 05/30/18 NA; prior to study initiation Baseline AST 37 U/L; Follow up AST 34 IU/L on 05/30/18  Blurred Visition 1 Baseline ongoing NA; prior to study initiation Medical History  Arthritis 1 Baseline ongoing NA; prior to study initiation Medical History; taking PRNnaproxen sodium  Dizziness 1 05/26/18 05/26/18 Unrelated Lasting less than 1 minute immediately following first RT treatment; self resolved  Upper Respiratory Infection 2 05/28/18 ongoing Unrelated Congestion, postnasal drip with sore throat and cough. Nasal saline spray PRN.  Headache 1 05/28/18 Resolved  Unrelated Taking acetaminophen and naproxen sodium PRN.Dull ache.  Fatigue 1 05/28/18 ongoing Unrelated Relived by rest; 07/02/18, slightly increased but still relieved by rest.  Nausea 1 06/06/18 ongoing Unrelated Relieved with PRN ondansetron.  Alopecia 1 06/11/18 ongoing Unrelated Two "spots" of hair loss; less than 50% hair loss.  Myalgia 1 06/11/18 ongoing Unrelated Patient newly reports on 7/17 ache along shins that began approximately 7/3.  Felt only at night when lying in bed, gone in the mornings.  Dysuria 1 06/25/18 ongoing  Intermittent burning with urination, relieved with hydration.  Flank pain 1 06/25/18 ongoing  Dull ache, intermittent, worse in morning, relieved with hydration.   Visit for (end of) Week 6 RT completion visit and corresponding lab visit were confirmed with  patient.  Patient was thanked for his ongoing participation in the study.  07/02/18 11:45AM  Following completion of the study visit, [facility].  Potassium remains <5.0 mmol/L; GFR has not decreased by 20% since last visit.  However, Creatinine has increased 20.8% from baseline.  Toxicity of creatinine increased by 20% has been met; per protocol patient cannot drop to lower dose (1.275mdaily) so patient is required to stop investigational Ramipril.   This RN spoke with Dr. VaMickeal Skinnerver the phone to discuss the new reports symptom of urinary burning and right flank aching and increased creatinine.  Instructed need follow up on urinary symptoms only if they become persistent.  Information relayed to patient.  Notification of need to stop taking investigational ramipril was also relayed to patient.  See phone encounter documentation for details.  StDoreatha MartinRN, BSN, [facility]/24/2019 2:08 PM

## 2018-07-03 ENCOUNTER — Ambulatory Visit
Admission: RE | Admit: 2018-07-03 | Discharge: 2018-07-03 | Disposition: A | Discharge status: Home / Self Care | Payer: PRIVATE HEALTH INSURANCE | Source: Ambulatory Visit | Attending: Radiation Oncology | Admitting: Radiation Oncology

## 2018-07-03 ENCOUNTER — Encounter: Payer: Self-pay | Admitting: *Deleted

## 2018-07-03 DIAGNOSIS — C712 Malignant neoplasm of temporal lobe: Secondary | ICD-10-CM | POA: Diagnosis not present

## 2018-07-03 NOTE — Progress Notes (Signed)
WF [id number]: (End of) Week 5 During RT Visit UPDATE  07/03/18 8:35AM  This RN met with [name] "[name]" and his spouse, Pamela.   Labs:  This RN provided the patient with a copy of the lab results received on 07/02/18 and reviewed these with the patient.  Based upon correspondence with [facility] study team the patient's Creatinine increase did NOT meet the protocol threshold of 20%.  Calculation should be based upon Creatinine reported for baseline, which utilizes only one decimal point.  Based upon this, the Creatinine has increased by 16% and patient may continue on investigational medication at maximum tolerated dose of 2.5mg QHS.  Medications:    Patient stated that he had received the voicemail message and took 2 capsules last night (07/02/18).  Patient denies any questions or concerns about drug dosing.  The patient brought his investigational Ramipril this morning for drug accountability check; patient forgot the medication bottle at yesterday's visit.  This RN took medication bottle to [facility] to be counted and pill count was 47.  Expected pill count was 47.  Patient was 100% compliant with dosing.  Medication bottle was returned to this RN who then returned the same medication bottle to the patient.  The patient verbalized an understanding of continuing nightly dosing at 2.5mg, 2 capsules.  Treatment plan for Week 6 released by this RN on 07/03/18, though Day 1 of Week 6 was 07/01/18; this is within protocol defined window for visit.  This RN encouraged patient to continue with hydration.  Reminded patient to call if the reported flank ache and burning with urination do not go away, or if he noticed any foul smelling urine.  Patient verbalized an understanding.  The patient was thanked for his ongoing participation in the study and thanked for his time.  Next study visit is for lab draw on Monday, 07/07/18, following the patient's last RT treatment.  Stacey Phelps, RN, BSN,  [facility] 07/03/2018 9:57 AM 

## 2018-07-04 ENCOUNTER — Ambulatory Visit
Admission: RE | Admit: 2018-07-04 | Discharge: 2018-07-04 | Disposition: A | Discharge status: Home / Self Care | Payer: PRIVATE HEALTH INSURANCE | Source: Ambulatory Visit | Attending: Radiation Oncology | Admitting: Radiation Oncology

## 2018-07-04 DIAGNOSIS — C712 Malignant neoplasm of temporal lobe: Secondary | ICD-10-CM | POA: Diagnosis not present

## 2018-07-07 ENCOUNTER — Encounter: Payer: Self-pay | Admitting: Radiation Oncology

## 2018-07-07 ENCOUNTER — Ambulatory Visit
Admission: RE | Admit: 2018-07-07 | Discharge: 2018-07-07 | Disposition: A | Discharge status: Home / Self Care | Payer: PRIVATE HEALTH INSURANCE | Source: Ambulatory Visit | Attending: Radiation Oncology | Admitting: Radiation Oncology

## 2018-07-07 ENCOUNTER — Telehealth: Payer: Self-pay | Admitting: *Deleted

## 2018-07-07 ENCOUNTER — Inpatient Hospital Stay: Payer: PRIVATE HEALTH INSURANCE

## 2018-07-07 DIAGNOSIS — C712 Malignant neoplasm of temporal lobe: Secondary | ICD-10-CM | POA: Diagnosis not present

## 2018-07-07 LAB — RESEARCH LABS

## 2018-07-07 NOTE — Telephone Encounter (Signed)
Received call from Case Manager (Toast) at [facility] ext [phone].  Just needed to confirm that patient is under clinical trail in addition to his treatment.  Advised all paperwork was submitted to them and they stated that they found the documents that we sent.  No further questions at this point.

## 2018-07-08 ENCOUNTER — Encounter: Payer: Self-pay | Admitting: Internal Medicine

## 2018-07-09 ENCOUNTER — Other Ambulatory Visit: Payer: Self-pay | Admitting: Internal Medicine

## 2018-07-09 ENCOUNTER — Telehealth: Payer: Self-pay | Admitting: *Deleted

## 2018-07-09 ENCOUNTER — Inpatient Hospital Stay: Payer: PRIVATE HEALTH INSURANCE | Admitting: *Deleted

## 2018-07-09 ENCOUNTER — Encounter: Payer: PRIVATE HEALTH INSURANCE | Admitting: *Deleted

## 2018-07-09 VITALS — BP 126/87 | HR 64

## 2018-07-09 DIAGNOSIS — C712 Malignant neoplasm of temporal lobe: Secondary | ICD-10-CM

## 2018-07-09 MED ORDER — TEMOZOLOMIDE 20 MG PO CAPS: 40.0000 mg | ORAL_CAPSULE | Freq: Every day | ORAL | 0 refills | Status: AC

## 2018-07-09 MED ORDER — INV-RAMIPRIL 1.25 MG CAP WF-1801 (#133): 2.5000 mg | ORAL_CAPSULE | Freq: Every day | ORAL | 0 refills | Status: DC

## 2018-07-09 MED ORDER — TEMOZOLOMIDE 140 MG PO CAPS: 140.0000 mg | ORAL_CAPSULE | Freq: Every day | ORAL | 0 refills | Status: AC

## 2018-07-09 NOTE — Telephone Encounter (Signed)
[id number]:  Week 6 RT Completion:  07/09/18 4:35PM  This RN called [name] to update him on dosing of Investigational Ramipril.  No answer, so this RN left voicemail with the following information:  Creatinine remains okay  Potassium is elevated; patient should continue to avoid potassium supplements and salt substitutes.    Elevated potassium requires patient to STOP dosing of investigational Ramipril.  Patient should take no more investigational Ramipril.  Provided contact information for questions or concerns for Merceda Elks, RN, while this RN is out of office.  Repeat of message to take no more investigational Ramipril.  Doreatha Martin, RN, BSN, [facility] 07/09/2018 4:43 PM

## 2018-07-09 NOTE — Addendum Note (Signed)
Addended by: Doreatha Martin E on: 07/09/2018 04:53 PM   Modules accepted: Orders

## 2018-07-09 NOTE — Progress Notes (Signed)
[id number]:  Week 6 RT Completion Visit  07/07/18:  Blood specimen for central lab processing was collected on Monday, 07/07/18.  Results from [place] were not yet received at the time of the patient's visit on 07/09/18.  Prior to the visit, this RN contacted the [facility] contact via phone and email to check on the status of the lab results for [id number], patient [id number] DR.    07/09/18 9:46AM  Patient Barry Barbara "[name]" is present today with his daughter, Barry Rios, for his schedule research visit.  Neurocognitive Testing:  Neurocognitive testing was performed by trained Research Specialist, Remer Macho, prior to the completion of any other visit procedures.  Questionnaires were completed immediately following completion of neurocognitive tests.  Vital Signs:  This RN measure and recorded the patient's blood pressure and heart rate with the patient in a seated position after a full five minutes of rest.  The patient's blood pressure is adequate to maintain patient at maximum tolerated dose of 2.5mg .  Adverse Events and Medications:  When asked if the patient has had any changes in his health or to his medications, the patient reports the following:  Patient is currently on scheduled break from temozolomide.  Patient confirms he took the medication as prescribed leading up to this prescribed break.  Nausea has resolved with the break in the temozolomide.  Patient last took a dose of zofran on 07/08/18.  However, zofran continues to be listed as a PRN medication because patient will be restarting temodar in the future per standard of care.  Patient reports no new adverse events or medication use.  Alopecia has slightly worsened, it is now readily apparent with ball cap removed.  Now at a grade 2.  Flank pain has completely resolved.  Patient has not experienced any further ache or pain at his right flank.  The last day he recalls feeling any aching in this area was on the day of his last visit.  07/02/18.  Dysuria - Patient still intermittently experiencing burning with urination, though it is no worse than last visit and continues to only be felt in the morning.  URI - cough is unchanged.  Fatigue has improved.  Patient is able to "go for 5-6 hours" before needing a rest break.  The patient is sleeping approximately 8-10 hours each day, with approximately 7-8 hours of sleep nightly with a daily 1-2 hour nap.  The patient states that the further he gets from radiation treatment the better he is feeling.  Myalgia has resolved.  Patient is no longer experiencing any aching along his bilateral shins.  The last day he noted any aching was the morning of his last visit, 07/02/18.  Patient does not report any intolerable Grade 3 adverse events; able to maintain dose at maximum tolerated dose of 2.5mg  daily per protocol.  Investigational Medication:  The patient returned his original pill bottle of Ramipril, bottle ID [id number].  This RN returned this bottle to the [facility] and a final drug count was completed.  35 capsules remain.  Patient has been 100% complaint with dosing.  Patient also returns his medication diary, and a copy was made for research records and the original returned the patient to document dosing for weeks 7-10 (calendar extends through August 4).  An additional calendar for the remainder of August was also provided to the patient to use for weeks 7-10 of study dosing.    At the time of the visit, lab results are unavailable to assess all toxicities per  protocol.  This RN again called [facility] contact prior to patient departure but no answer and no results had been emailed or faxed to the site at the time of the end of the visit.  Patient is no longer having daily appointments at the [facility] and, so based upon available information at the time, two bottles of investigational Ramipril were dispensed to the subject, bottle 1/4 and bottle 2/4 (all bottles with same bottle ID [id number]).   This RN instructed the patient to continue taking 2.5mg  QHS, unless instructed otherwise based off of lab results.  Patient acknowledges and verbalizes an understanding.  Patient is agreeable to discontinue dosing if required based upon lab results, or to continue dosing if lab results allow.    Adverse Events Table: Adverse Event Grade (CTCAE v5.0) Start Date End Date Relatedness(to Ramipril) Comments  Hepatobiliary disorder, other: Fatty liver 1 Baseline ongoing NA; prior to study initiation Medical history, no interventions. AST and ALT slightly elevated at Baseline.  Alanine aminotransferase increased 1 Baseline ongoing NA; prior to study initiation Baseline ALT 68 U/L;  07/07/18 ALT 47 IU/L  Aspartate aminotransferase increased 1 Baseline Resolved 05/30/18 NA; prior to study initiation Baseline AST 37 U/L; Follow up AST 34 IU/L on 05/30/18  Blurred Visition 1 Baseline ongoing NA; prior to study initiation Medical History  Arthritis 1 Baseline ongoing NA; prior to study initiation Medical History; taking PRNnaproxen sodium  Dizziness 1 05/26/18 05/26/18 Unrelated Lasting less than 1 minute immediately following first RT treatment; self resolved  Upper Respiratory Infection 2 05/28/18 ongoing Unrelated Congestion, postnasal drip with sore throat and cough. Nasal saline spray PRN.  Headache 1 05/28/18 Resolved  Unrelated Taking acetaminophen and naproxen sodium PRN.Dull ache.  Fatigue 1 05/28/18 ongoing Unrelated Relived by rest; 07/07/18, slightly improved but ongoing.  Nausea 1 06/06/18 Resolved 07/08/18 Unrelated Last dose ondansetron 07/08/18 (medication remains PRN).  Alopecia 2 06/11/18 ongoing Unrelated 07/09/18 - worsened to grade 2.  Myalgia 1 06/11/18 Resolved 07/02/18 Unrelated   Dysuria 1 06/25/18 ongoing Unrelated Intermittent burning with urination, worse in morning  Flank pain 1 06/25/18 Resolved 07/02/18 Unrelated     Next study visit was scheduled with patient; to be completed on  July 31, 2018, immediately following routine visit with Dr. Mickeal Skinner.  Patient is in agreement with this timing; patient understands that he will be contacted by Radiology to schedule his routine MRI and that he will also have labs drawn near the time of his visit.  The patient was thanked for his ongoing participation in the study.  Following Visit:  Lab Results:  Lab specimen was process and results then provided to the site on 07/09/18 after the completion of the study visit.  Elevated Potassium with a potassium of greater than 5.0 was resulted.  This RN contacted Dr. Mickeal Skinner over the phone and notified him of patient's elevated potassium of 5.4 mmol/L, also discussed the timing of collection and processing of blood sample.  No new orders at this time, Dr. Mickeal Skinner will follow.  Creatinine has not increased by 20% from baseline and GFR has not decreased by 20% from last visit.  However, potassium level was greater than 5.0 mmol/L necessitating the patient stop investigational medication dosing.  The patient was contacted to notify him of dosing change.  See telephone correspondence for documentation.  Doreatha Martin, RN, BSN, [facility] 07/09/2018 4:39 PM

## 2018-07-10 ENCOUNTER — Telehealth: Payer: Self-pay | Admitting: *Deleted

## 2018-07-10 ENCOUNTER — Inpatient Hospital Stay: Payer: PRIVATE HEALTH INSURANCE | Attending: Internal Medicine

## 2018-07-10 DIAGNOSIS — I10 Essential (primary) hypertension: Secondary | ICD-10-CM | POA: Insufficient documentation

## 2018-07-10 DIAGNOSIS — Z006 Encounter for examination for normal comparison and control in clinical research program: Secondary | ICD-10-CM | POA: Insufficient documentation

## 2018-07-10 DIAGNOSIS — M129 Arthropathy, unspecified: Secondary | ICD-10-CM | POA: Diagnosis not present

## 2018-07-10 DIAGNOSIS — K760 Fatty (change of) liver, not elsewhere classified: Secondary | ICD-10-CM | POA: Insufficient documentation

## 2018-07-10 DIAGNOSIS — Z79899 Other long term (current) drug therapy: Secondary | ICD-10-CM | POA: Insufficient documentation

## 2018-07-10 DIAGNOSIS — C712 Malignant neoplasm of temporal lobe: Secondary | ICD-10-CM | POA: Insufficient documentation

## 2018-07-10 DIAGNOSIS — Z87891 Personal history of nicotine dependence: Secondary | ICD-10-CM | POA: Diagnosis not present

## 2018-07-10 DIAGNOSIS — F418 Other specified anxiety disorders: Secondary | ICD-10-CM | POA: Insufficient documentation

## 2018-07-10 DIAGNOSIS — Z87442 Personal history of urinary calculi: Secondary | ICD-10-CM | POA: Insufficient documentation

## 2018-07-10 DIAGNOSIS — Z9221 Personal history of antineoplastic chemotherapy: Secondary | ICD-10-CM | POA: Diagnosis not present

## 2018-07-10 LAB — CMP (CANCER CENTER ONLY)
ALK PHOS: 52 U/L (ref 38–126)
ALT: 53 U/L — AB (ref 0–44)
ANION GAP: 6 (ref 5–15)
AST: 33 U/L (ref 15–41)
Albumin: 4.3 g/dL (ref 3.5–5.0)
BILIRUBIN TOTAL: 0.7 mg/dL (ref 0.3–1.2)
BUN: 11 mg/dL (ref 6–20)
CALCIUM: 9.8 mg/dL (ref 8.9–10.3)
CO2: 31 mmol/L (ref 22–32)
CREATININE: 1.05 mg/dL (ref 0.61–1.24)
Chloride: 102 mmol/L (ref 98–111)
Glucose, Bld: 88 mg/dL (ref 70–99)
Potassium: 4.2 mmol/L (ref 3.5–5.1)
Sodium: 139 mmol/L (ref 135–145)
TOTAL PROTEIN: 7 g/dL (ref 6.5–8.1)

## 2018-07-10 LAB — RESEARCH LABS

## 2018-07-10 NOTE — Telephone Encounter (Addendum)
Received email from [facility], [place] regarding how the study wishes to proceed with the [facility] K+ result and delay in resulting. WF requests patient remain on study drug at 2.5 mg and redraw the [facility] sample. Per discussion with Dr. Mickeal Skinner and research team, it was decided to re-draw the [facility] sample as well as draw 2nd tube Cmet to have run stat at [facility] to confirm K+ is <5.0 and safe to continue study drug until [facility] results are received.  Research nurse attempted to reach patient, but was required to leave a detailed voice mail regarding lab draw and rationale for re-draw. Called his wife, [name] and reached her at work. She reports most likely Ja[place] was napping when research called. Informed her of WF request and how we would like to proceed and she agrees. He does not have transportation today, so it was decided to have labs drawn on 07/11/18 at 0815 and he will continue to hold the Ramipril until local Cmet result shows K+ is in appropriate range to take medication. Wife was informed that he will not be charged for the Cmet run at [facility] tomorrow (research department will cover this cost). She will call him when he is up from his nap and inform him of the plan and have him call if he has any questions or concerns. Sent email to T. Vogler at [facility] with the plan. Mauri Reading Michaelle Copas RN, BSN Clinical Research Nurse 07/10/18 @ 1205  07/10/18 @ 1230: Return call from patient and he tells RN that he has transportation and wants to have labs drawn today as first suggested. He will come in by 2 pm for labs and research nurse will call him when results are available. Mauri Reading Michaelle Copas, Therapist, sports, Product/process development scientist

## 2018-07-10 NOTE — Telephone Encounter (Signed)
07/10/18 @ 1515: Research nurse called patient back and informed him that his K+ level run at [facility] was normal at 4.5 and instructed him to continue his ramipril 2.5 mg daily and to continue to document on his calendar. He also informed research nurse that he did not retrieve the voice mail left yesterday and he took his medication on 07/09/18. Informed him that he will be called when the [facility] results return as well. Mauri Reading Michaelle Copas, Therapist, sports, Product/process development scientist

## 2018-07-11 ENCOUNTER — Telehealth: Payer: Self-pay | Admitting: *Deleted

## 2018-07-11 ENCOUNTER — Other Ambulatory Visit: Payer: PRIVATE HEALTH INSURANCE

## 2018-07-11 NOTE — Telephone Encounter (Signed)
Called patient and informed him that the K+ result from repeat draw for [facility] is normal at 4.9.He will continue ramipril at 2.5 mg daily and record on his calendar. Mauri Reading Michaelle Copas Therapist, sports, BSN Clinical Research Nurse 07/11/18 @ 1318

## 2018-07-15 NOTE — Progress Notes (Signed)
FMLA successfully faxed to [employer] attn: Kandra Nicolas at [phone].

## 2018-07-16 ENCOUNTER — Encounter: Payer: Self-pay | Admitting: Internal Medicine

## 2018-07-16 ENCOUNTER — Encounter: Payer: Self-pay | Admitting: *Deleted

## 2018-07-16 DIAGNOSIS — C712 Malignant neoplasm of temporal lobe: Secondary | ICD-10-CM

## 2018-07-16 MED ORDER — INV-RAMIPRIL 1.25 MG CAP WF-1801 (#112)
2.5000 mg | ORAL_CAPSULE | Freq: Every day | ORAL | Status: DC
Start: 2018-07-16 — End: 2018-07-30

## 2018-07-16 NOTE — Progress Notes (Signed)
  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 The initial enhancing tumor, peritumoral edema, plus 1 cm were treated to 46 Gy in 23 fractions. 2. The enhancing tumor plus 1 cm was boosted to 60 Gy with 7 additional fractions of 2 Gy.  Beams/energy:  1. The initial enhancing tumor, peritumoral edema, plus 1 cm were treated using helical intensity modulated radiotherapy delivering 6 megavolt photons. Image guidance was performed with megavoltage CT studies prior to each fraction. He was immobilized with a thermoplastic mask. 2. The enhancing tumor plus 1 cm was boosted using helical intensity modulated radiotherapy delivering 6 megavolt photons. Image guidance was performed with megavoltage CT studies prior to each fraction. He was immobilized with a a thermoplastic mask.  Narrative: The patient tolerated radiation treatment relatively well.  Some alopecia with mild erythema was noted in the treated area. He is applying Sonafine to this area. He experienced increased fatigue and reported having blurred vision following treatments that resolved on its own after a couple hours. He also had occasional headaches and tinnitus. No complaints of hearing or memory loss or motor movement changes.  Plan: The patient has completed radiation treatment. The patient will return to radiation oncology clinic for routine followup in one month. I advised him to call or return sooner if he has any questions or concerns related to his recovery or treatment. ________________________________  Jodelle Gross, MD, PhD  This document serves as a record of services personally performed by Kyung Rudd, MD. It was created on his behalf by Rae Lips, a trained medical scribe. The creation of this record is based on the scribe's personal observations and the provider's statements to them. This document has been checked and approved by the attending provider.

## 2018-07-24 ENCOUNTER — Other Ambulatory Visit: Payer: Self-pay | Admitting: *Deleted

## 2018-07-24 DIAGNOSIS — C712 Malignant neoplasm of temporal lobe: Secondary | ICD-10-CM

## 2018-07-27 ENCOUNTER — Ambulatory Visit
Admission: RE | Admit: 2018-07-27 | Discharge: 2018-07-27 | Disposition: A | Discharge status: Home / Self Care | Payer: PRIVATE HEALTH INSURANCE | Source: Ambulatory Visit | Attending: Internal Medicine | Admitting: Internal Medicine

## 2018-07-27 DIAGNOSIS — C712 Malignant neoplasm of temporal lobe: Secondary | ICD-10-CM

## 2018-07-27 MED ORDER — GADOBENATE DIMEGLUMINE 529 MG/ML IV SOLN
20.0000 mL | Freq: Once | INTRAVENOUS | Status: AC | PRN
  Administered 2018-07-27: 20 mL via INTRAVENOUS

## 2018-07-29 ENCOUNTER — Telehealth: Payer: Self-pay | Admitting: *Deleted

## 2018-07-29 NOTE — Telephone Encounter (Signed)
[id number]  07/29/18 9:24 AM This RN left voicemail for [name] with times for appointments, Routine visit with Dr. Mickeal Skinner and lab, and research time on Thursday, Aug 22.  Reminded patient to bring drug diary and medication bottles to visit.  Left contact information for call back in case of any questions or concerns. Doreatha Martin, RN, BSN, [facility] 07/29/2018 9:26 AM

## 2018-07-31 ENCOUNTER — Inpatient Hospital Stay: Payer: PRIVATE HEALTH INSURANCE | Admitting: Internal Medicine

## 2018-07-31 ENCOUNTER — Other Ambulatory Visit: Payer: Self-pay

## 2018-07-31 ENCOUNTER — Telehealth: Payer: Self-pay | Admitting: Internal Medicine

## 2018-07-31 ENCOUNTER — Encounter: Payer: Self-pay | Admitting: Internal Medicine

## 2018-07-31 ENCOUNTER — Inpatient Hospital Stay: Payer: PRIVATE HEALTH INSURANCE

## 2018-07-31 ENCOUNTER — Other Ambulatory Visit: Payer: PRIVATE HEALTH INSURANCE

## 2018-07-31 ENCOUNTER — Encounter: Payer: PRIVATE HEALTH INSURANCE | Admitting: *Deleted

## 2018-07-31 ENCOUNTER — Inpatient Hospital Stay: Payer: PRIVATE HEALTH INSURANCE | Admitting: *Deleted

## 2018-07-31 ENCOUNTER — Other Ambulatory Visit: Payer: Self-pay | Admitting: *Deleted

## 2018-07-31 VITALS — BP 125/91 | HR 58 | Temp 98.4°F | Wt 247.4 lb

## 2018-07-31 DIAGNOSIS — M129 Arthropathy, unspecified: Secondary | ICD-10-CM

## 2018-07-31 DIAGNOSIS — Z87442 Personal history of urinary calculi: Secondary | ICD-10-CM

## 2018-07-31 DIAGNOSIS — Z006 Encounter for examination for normal comparison and control in clinical research program: Secondary | ICD-10-CM

## 2018-07-31 DIAGNOSIS — F418 Other specified anxiety disorders: Secondary | ICD-10-CM

## 2018-07-31 DIAGNOSIS — Z79899 Other long term (current) drug therapy: Secondary | ICD-10-CM

## 2018-07-31 DIAGNOSIS — C712 Malignant neoplasm of temporal lobe: Secondary | ICD-10-CM

## 2018-07-31 DIAGNOSIS — Z87891 Personal history of nicotine dependence: Secondary | ICD-10-CM

## 2018-07-31 DIAGNOSIS — Z9221 Personal history of antineoplastic chemotherapy: Secondary | ICD-10-CM

## 2018-07-31 DIAGNOSIS — K760 Fatty (change of) liver, not elsewhere classified: Secondary | ICD-10-CM

## 2018-07-31 DIAGNOSIS — I10 Essential (primary) hypertension: Secondary | ICD-10-CM

## 2018-07-31 LAB — CBC WITH DIFFERENTIAL (CANCER CENTER ONLY)
Basophils Absolute: 0.1 10*3/uL (ref 0.0–0.1)
Basophils Relative: 1 %
EOS ABS: 0.2 10*3/uL (ref 0.0–0.5)
EOS PCT: 3 %
HCT: 45.3 % (ref 38.4–49.9)
Hemoglobin: 15.8 g/dL (ref 13.0–17.1)
LYMPHS ABS: 0.8 10*3/uL — AB (ref 0.9–3.3)
Lymphocytes Relative: 14 %
MCH: 32.2 pg (ref 27.2–33.4)
MCHC: 34.9 g/dL (ref 32.0–36.0)
MCV: 92.3 fL (ref 79.3–98.0)
MONOS PCT: 10 %
Monocytes Absolute: 0.6 10*3/uL (ref 0.1–0.9)
Neutro Abs: 4.2 10*3/uL (ref 1.5–6.5)
Neutrophils Relative %: 72 %
PLATELETS: 426 10*3/uL — AB (ref 140–400)
RBC: 4.91 MIL/uL (ref 4.20–5.82)
RDW: 13.4 % (ref 11.0–14.6)
WBC Count: 5.8 10*3/uL (ref 4.0–10.3)

## 2018-07-31 LAB — CMP (CANCER CENTER ONLY)
ALBUMIN: 4.3 g/dL (ref 3.5–5.0)
ALT: 53 U/L — ABNORMAL HIGH (ref 0–44)
ANION GAP: 7 (ref 5–15)
AST: 33 U/L (ref 15–41)
Alkaline Phosphatase: 51 U/L (ref 38–126)
BUN: 14 mg/dL (ref 6–20)
CO2: 29 mmol/L (ref 22–32)
Calcium: 9.7 mg/dL (ref 8.9–10.3)
Chloride: 104 mmol/L (ref 98–111)
Creatinine: 0.98 mg/dL (ref 0.61–1.24)
GFR, Est AFR Am: >60 mL/min (ref >60)
GFR, Estimated: >60 mL/min (ref >60)
GLUCOSE: 91 mg/dL (ref 70–99)
POTASSIUM: 4.3 mmol/L (ref 3.5–5.1)
SODIUM: 140 mmol/L (ref 135–145)
TOTAL PROTEIN: 7 g/dL (ref 6.5–8.1)
Total Bilirubin: 0.7 mg/dL (ref 0.3–1.2)

## 2018-07-31 MED ORDER — INV-RAMIPRIL 1.25 MG CAP WF-1801 (#112): 2.5000 mg | ORAL_CAPSULE | Freq: Every day | ORAL | 0 refills | Status: DC
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
## 2018-07-31 NOTE — Telephone Encounter (Signed)
Appts scheduled AVS/Calendar printed per 8/22 los °

## 2018-07-31 NOTE — Progress Notes (Addendum)
[id number]: 1 Month Post RT, Week 10 Visit  07/31/18 8:08AM  [name] "Barry Rios" is present today with his wife [name], for his scheduled research visit and for his routine visit with Dr. Mickeal Skinner.     Lab:  Patient had blood work drawn on site.  The patient's potassium is within normal limits, <5.0 mmol/L, Creatinine has not increased by 20% from baseline, and calculated GFR has not decreased by 20% from last visit.  Able to maintain maximum tolerated dose, per protocol.  Neurocognitive testing:  Neurocognitive testing was performed by trained Research Specialist, Remer Macho, prior to the completion of other visit procedures (excepting blood draw, results not yet available at time of neurocognitive testing).  Questionnaires were completed immediately following completion of neurocognitive tests.  The patient would like to have a copy of the neurocognitive test results.  This RN will follow up with the study team at [facility] to determine if any of the results can be shared.  Patient understands the results may not be allowed to be shared because the neurocognitive testing will be repeated at future visits.    Vital Signs:  This RN measured and recorded the patient's blood pressure and heart rate with the patient in a seated position after a full five minutes of rest.  Temperature also measured and recorded.  See patient's encounter with Dr. Mickeal Skinner for documentation of vital signs.  The patient's blood pressure is adequate to maintain patient at maximum tolerated dose of 2.5mg .  Adverse Events and Medications:  When asked if the patient has had any changes in his health or to his medications, the patient reports the following: ? Patient reports no new adverse events or new medications.  Patient reports he has completed use of the sonafide medication used after RT.  The last date of use was the date of the patient's last RT (07/07/18). ? Patient reports that he is experiencing tension headaches.   This is part of his medical history and is unchanged from baseline.  Patient takes tylenol PRN.  The patient has had no further headaches like he experienced during RT treatment. ? Alopecia has started to improve with slight hair regrowth per patient. Hair loss still exceeds 50% hair loss as of today.  ? Dysuria - Patient still experiencing burning with urination; though somewhat improved as of this visit. Patient now experiences on some days of the week, not all days; continues to only be felt in the morning.  Continues to improve and go away with increased water intake. ? URI - cough continues to be unchanged.  Patient has stopped using the nasal saline spray, having lasted used the OTC medication approximately 6 weeks ago (06/19/18). ? Fatigue has improved.  Patient has returned to work, working 6 hours M-F.  He is sleeping at night and requires a nap occasionally, but no longer every day.   ? Patient does not report any intolerable Grade 3 adverse events; able to maintain dose at maximum tolerated dose of 2.5mg  daily per protocol.  Investigational Medication:    Return:  The patient returned pill bottles 191952, bottles 1/4 and 2/4.  The patient had taken medication out of both pill bottles.  This RN delivered the investigational Ramirpril to the Pharmacy where drug count was performed by Pharmacist Raul Del.  18 capsules remained in bottle 1 and 50 capsules remained in bottle 2; for a total of 68 remaining capsules of the 112 capsules dispensed.  Patient has been 100% compliant with daily  dosing at maximum tolerated dose of 2.5mg , 2 capsules, QHS. Patient also returns his signed medication diary. The July calendar and a copy of the August calendar was made and retained with the patient's research records.    Dispense:  The patient is able to continue dosing at maximum tolerated dose of 2.5mg , 2 capsules, QHS per protocol.  Patient was returned bottles 1/4 and 2/4 of supply 191952 (total 68  capsules).  Patient was additionally provided bottles 3/4 and 4/4 of the 191952 investigational ramipril bottles (112 capsules).  Total 180 capsules were provided to the patient.  The patient was educated to continue dosing as he is currently, and the patient verbalized an understanding and denied any questions.  This RN returned the patient's August calendar to him, along with calendars through November 2019 to record weeks 8-22 of dosing, and instructed to continue to document his daily dosing on the medication diary  Adverse Events Table: Adverse Event Grade (CTCAE v5.0) Start Date End Date Relatedness(to Ramipril) Comments  Hepatobiliary disorder, other: Fatty liver 1 Baseline ongoing NA; prior to study initiation Medical history, no interventions. AST and ALT slightly elevated at Baseline.  Alanine aminotransferase increased 1 Baseline ongoing NA; prior to study initiation Baseline ALT 68 U/L;07/31/18 ALT 53U/L  Aspartate aminotransferase increased 1 Baseline Resolved 05/30/18 NA; prior to study initiation Baseline AST 37 U/L; Follow up AST 34 IU/L on 05/30/18  Blurred Visition 1 Baseline ongoing NA; prior to study initiation Medical History  Arthritis 1 Baseline ongoing NA; prior to study initiation Medical History; taking PRNnaproxen sodium  Dizziness 1 05/26/18 05/26/18 Unrelated Lasting less than 1 minute immediately following first RT treatment; self resolved  Upper Respiratory Infection 2 05/28/18 ongoing Unrelated Congestion, postnasal drip with sore throat and cough.  Headache 1 05/28/18 Resolved Unrelated Taking acetaminophen and naproxen sodium PRN.Dull ache.  Fatigue 1 05/28/18 ongoing Unrelated Relived byrest; 07/31/18, continues to slightly improve.  Nausea 1 06/06/18 Resolved 07/08/18 Unrelated Last dose ondansetron 07/08/18 (medication remains PRN).  Alopecia 2 06/11/18 ongoing Unrelated 07/09/18 - worsened to grade 2  Myalgia 1 06/11/18 Resolved 07/02/18 Unrelated   Dysuria 1  06/25/18 ongoing Unrelated Intermittent burning with urination, worse in morning  Flank pain 1 06/25/18 Resolved 07/02/18 Unrelated    Patient was seen by Dr. Mickeal Skinner today for history and physical.  Please see his documentation in the Office Visit encounter from 07/31/18.  Next study visit has not yet been scheduled; but is anticipated around 10/21/2018.  This is anticipated to occur near a time of a routine visit with Dr. Mickeal Skinner, so the visit will be scheduled to coincide with that routine visit once it has been scheduled.  The patient understands that he will have brief phone visits prior to the next in person visit.  The patient was thanked for his ongoing participation in the study.    Doreatha Martin, RN, BSN, [facility] 07/31/2018 12:28 PM

## 2018-08-01 ENCOUNTER — Telehealth: Payer: Self-pay

## 2018-08-01 NOTE — Telephone Encounter (Signed)
Per 8/22 los completed by Sharyne Peach.

## 2018-08-05 ENCOUNTER — Other Ambulatory Visit: Payer: Self-pay | Admitting: Internal Medicine

## 2018-08-05 DIAGNOSIS — C712 Malignant neoplasm of temporal lobe: Secondary | ICD-10-CM

## 2018-08-05 MED ORDER — TEMOZOLOMIDE 140 MG PO CAPS: 140.0000 mg | ORAL_CAPSULE | Freq: Every day | ORAL | 0 refills | Status: AC

## 2018-08-05 MED ORDER — ONDANSETRON HCL 8 MG PO TABS: 8.0000 mg | ORAL_TABLET | Freq: Two times a day (BID) | ORAL | 1 refills | Status: DC | PRN

## 2018-08-05 MED ORDER — TEMOZOLOMIDE 100 MG PO CAPS: 200.0000 mg | ORAL_CAPSULE | Freq: Every day | ORAL | 0 refills | Status: AC

## 2018-08-05 NOTE — Progress Notes (Signed)
DISCONTINUE ON PATHWAY REGIMEN - Neuro     One cycle, daily for 42 days concurrent with RT:     Temozolomide   **Always confirm dose/schedule in your pharmacy ordering system**  REASON: Continuation Of Treatment PRIOR TREATMENT: [id number]: Radiation Therapy with Concurrent Temozolomide 75 mg/m2 Daily x 6 Weeks, Followed by Sequential Temozolomide TREATMENT RESPONSE: Stable Disease (SD)  START ON PATHWAY REGIMEN - Neuro     A cycle is every 28 days:     Temozolomide      Temozolomide   **Always confirm dose/schedule in your pharmacy ordering system**  Patient Characteristics: Glioblastoma, Newly Diagnosed / Treatment Naive, Good Performance Status and/or Younger Patient Disease Classification: Glioma Disease Classification: Glioblastoma Disease Status: Newly Diagnosed / Treatment Naive Performance Status: Good Performance Status and/or Younger Patient Intent of Therapy: Non-Curative / Palliative Intent, Discussed with Patient

## 2018-08-12 ENCOUNTER — Telehealth: Payer: Self-pay | Admitting: Internal Medicine

## 2018-08-12 NOTE — Progress Notes (Signed)
Disability paperwork successfully faxed to [employer] at [phone]. Mailed copy to patient address on file.

## 2018-08-12 NOTE — Telephone Encounter (Signed)
Printed medical records for [employer], Release ID: [id number]

## 2018-08-20 ENCOUNTER — Telehealth: Payer: Self-pay | Admitting: Internal Medicine

## 2018-08-20 ENCOUNTER — Telehealth: Payer: Self-pay

## 2018-08-20 DIAGNOSIS — C712 Malignant neoplasm of temporal lobe: Secondary | ICD-10-CM

## 2018-08-20 NOTE — Telephone Encounter (Signed)
Patient called to state that he received the Temodar and wanted to know when to start the medication. Dr. Mickeal Skinner instructed to start the medication today. Also made the patient aware that the appointment for 9/27 will be cancelled and rescheduled 4 weeks out, in October and that appointment will consist of labs and MD. Patient verbalized understanding and had no other questions or concerns. Scheduling message sent to reschedule 9/27 appointment.

## 2018-08-20 NOTE — Telephone Encounter (Signed)
Appts r/s pt notified per 9/11 sch msg

## 2018-08-25 ENCOUNTER — Ambulatory Visit: Payer: PRIVATE HEALTH INSURANCE | Admitting: Radiation Oncology

## 2018-09-03 ENCOUNTER — Telehealth: Payer: Self-pay | Admitting: *Deleted

## 2018-09-03 NOTE — Telephone Encounter (Addendum)
WF 1801 Ramipril - 2 Month Post RT Call  09/03/18 3:43 PM  This RN attempted to contact Barry Rios "[name]" for protocol required call.  Left voicemail requesting call back; contact information provided. Doreatha Martin, RN, BSN, [facility] 09/03/2018 3:43 PM  09/04/18 11:16AM  [name] returned call from this RN to complete the 2 Month Post RT call.  Please see Research Encounter from this date for documentation of visit.  Patient was thanked for his call.  Doreatha Martin, RN, BSN, [facility] 09/04/2018 2:07 PM

## 2018-09-04 ENCOUNTER — Encounter: Payer: Self-pay | Admitting: *Deleted

## 2018-09-04 DIAGNOSIS — C712 Malignant neoplasm of temporal lobe: Secondary | ICD-10-CM

## 2018-09-04 NOTE — Research (Signed)
[id number] 2 Month Post RT Phone Call Visit  09/04/18 11:16AM  [name] returned call from this RN.  The following was discussed:  Adverse Events and Concomitant Medications:  Patient reports that overall he is doing well.  He has not yet made a decision on if he would like to use the Optune device.  He reports the following:  Temodar - the patient took temodar as prescribed, starting 08/20/18 (date he received the medication).  He stated that he felt fine on the first two days of dosing, but began to have headaches and fatigue beginning on 08/22/18.  The patient took off from work due to the severity of the symptoms (grade 2 headache and fatigue, limiting instrumental ADLs), beginning on 08/22/18 until 08/29/18 when he experienced a resolution of these symptoms with complete resolution of the new headache and return to grade 1 fatigue.  The patient took PRN medications tylenol for headache and zofran for nausea prophylaxis during this time.  The patient did not experience any nausea due to the prophylactic use of zofran.  The patient reports no other changes in health or medications.  URI - ongoing, though cough has waxed and waned over the past few weeks, with some days the patient not experiencing a cough.  Blurred vision and tension headaches have not changed and are the same as were at baseline.    Fatigue has returned to a grade 1 since 08/29/18.  Patient is working part time, up to about 6 hours a day.  Dysuria (burning with urination) is still ongoing without change since last visit.  Alopecia is ongoing, patient stating that his hair is slowly growing back.  The patient does not report any intolerable grade 3 adverse effects; able to maintain dose of investigational ramipril at 2.5mg  daily, per protocol.  Adverse Event Table: Adverse Event Grade (CTCAE v5.0) Start Date End Date Relatedness(to Ramipril) Comments  Hepatobiliary disorder, other: Fatty liver 1 Baseline ongoing NA;  prior to study initiation Medical history, no intervention.  Alanine aminotransferase increased 1 Baseline ongoing NA; prior to study initiation Baseline ALT 68 U/L;07/31/18 ALT 53U/L  Aspartate aminotransferase increased 1 Baseline Resolved 05/30/18 NA; prior to study initiation   Blurred Visition 1 Baseline ongoing NA; prior to study initiation Medical History  Arthritis 1 Baseline ongoing NA; prior to study initiation Medical History; taking PRNnaproxen sodium  Dizziness 1 05/26/18 05/26/18 Unrelated Self resolved  Upper Respiratory Infection 2 05/28/18 ongoing Unrelated 09/04/18 - intermittent cough ongoing  Headache 1 05/28/18 Resolved 06/27/18 Unrelated   Fatigue 1 05/28/18 08/21/18 Unrelated Worsened to Grade 2  Fatigue 2 08/22/18 Resolved 08/29/18 Unrelated Concurrent with temodar dosing; Unable to go to work, spent most of the day resting; resolved to grade 1 after 08/29/18.  Fatigue 1 08/30/18 ongoing Unrelated Relived byrest  Nausea 1 06/06/18 Resolved 07/08/18 Unrelated   Alopecia 2 06/11/18 ongoing Unrelated 07/09/18 - worsened to grade 2  Myalgia 1 06/11/18 Resolved 07/02/18 Unrelated   Dysuria 1 06/25/18 ongoing Unrelated Intermittent burning with urination,worse in morning  Flank pain 1 06/25/18 Resolved 07/02/18 Unrelated   Headache 2 08/22/18 Resolved 08/29/18 Unrelated Concurrent with temodar dosing; Unable to go to work, spent most of the day resting; resolved after 08/29/18.   Investigational Medication:  Patient reports that he has continued the two capsules, 2.5mg , daily nighttime dosing of investigational Ramipril since his last visit.  He has not missed any doses.  Because this is a phone visit, the patient plans to either mail in  his August medication diary, or drop it off at the [facility] clinic.The patient is able to review with this RN the medication diary over the phone and confirm he has not missed any doses of investigational medication.  The patient is instructed to  continue dosing at maximum tolerated dose of 2.5mg , 2 capsules, QHS per protocol.  Patient verbalizes an understanding and denies any questions or concerns at this time.    Visit Scheduling:  Patient stated that he had a routine visit planned with Dr. Mickeal Skinner on 09/05/18.  This RN reviewed the patient's scheduled appointments and noted that the planned visit was delayed to 09/17/18.  The patient then remembered the visit had been moved and explained that there had been a delay in his receipt of the temodar, which caused the subsequent delay in visit with Dr. Mickeal Skinner.  The patient is able to attend the routine visit on 09/17/18.  This date is within window for the 3 month post RT call for the patient, so this RN explained that she would attempt to see the patient on that date to complete the next study visit.  The patient is agreeable to this, or to a phone call visit in approximately 1 month.  The patient was thanked for his ongoing participation in the WF1801 study.  Doreatha Martin, RN, BSN, [facility] 09/04/2018 1:54 PM

## 2018-09-05 ENCOUNTER — Other Ambulatory Visit: Payer: Self-pay | Admitting: Internal Medicine

## 2018-09-05 ENCOUNTER — Other Ambulatory Visit: Payer: PRIVATE HEALTH INSURANCE

## 2018-09-05 ENCOUNTER — Ambulatory Visit: Payer: PRIVATE HEALTH INSURANCE | Admitting: Internal Medicine

## 2018-09-06 ENCOUNTER — Other Ambulatory Visit: Payer: Self-pay | Admitting: Internal Medicine

## 2018-09-06 DIAGNOSIS — C712 Malignant neoplasm of temporal lobe: Secondary | ICD-10-CM

## 2018-09-16 ENCOUNTER — Other Ambulatory Visit: Payer: Self-pay | Admitting: Lab

## 2018-09-17 ENCOUNTER — Inpatient Hospital Stay: Payer: PRIVATE HEALTH INSURANCE | Attending: Internal Medicine

## 2018-09-17 ENCOUNTER — Encounter: Payer: Self-pay | Admitting: Internal Medicine

## 2018-09-17 ENCOUNTER — Inpatient Hospital Stay: Payer: PRIVATE HEALTH INSURANCE | Admitting: Internal Medicine

## 2018-09-17 ENCOUNTER — Telehealth: Payer: Self-pay

## 2018-09-17 ENCOUNTER — Inpatient Hospital Stay: Payer: PRIVATE HEALTH INSURANCE | Admitting: *Deleted

## 2018-09-17 VITALS — BP 104/76 | HR 60 | Temp 98.3°F | Resp 17 | Ht 71.25 in | Wt 242.7 lb

## 2018-09-17 DIAGNOSIS — Z87891 Personal history of nicotine dependence: Secondary | ICD-10-CM | POA: Insufficient documentation

## 2018-09-17 DIAGNOSIS — Z79899 Other long term (current) drug therapy: Secondary | ICD-10-CM | POA: Insufficient documentation

## 2018-09-17 DIAGNOSIS — C712 Malignant neoplasm of temporal lobe: Secondary | ICD-10-CM

## 2018-09-17 DIAGNOSIS — R5383 Other fatigue: Secondary | ICD-10-CM | POA: Diagnosis not present

## 2018-09-17 LAB — CBC WITH DIFFERENTIAL (CANCER CENTER ONLY)
ABS IMMATURE GRANULOCYTES: 0.01 10*3/uL (ref 0.00–0.07)
Basophils Absolute: 0.1 10*3/uL (ref 0.0–0.1)
Basophils Relative: 2 %
EOS PCT: 3 %
Eosinophils Absolute: 0.2 10*3/uL (ref 0.0–0.5)
HEMATOCRIT: 47.4 % (ref 39.0–52.0)
HEMOGLOBIN: 16.2 g/dL (ref 13.0–17.0)
Immature Granulocytes: 0 %
LYMPHS ABS: 1.2 10*3/uL (ref 0.7–4.0)
LYMPHS PCT: 18 %
MCH: 30.8 pg (ref 26.0–34.0)
MCHC: 34.2 g/dL (ref 30.0–36.0)
MCV: 90.1 fL (ref 80.0–100.0)
MONO ABS: 0.5 10*3/uL (ref 0.1–1.0)
MONOS PCT: 7 %
NEUTROS ABS: 4.5 10*3/uL (ref 1.7–7.7)
Neutrophils Relative %: 70 %
Platelet Count: 525 10*3/uL — ABNORMAL HIGH (ref 150–400)
RBC: 5.26 MIL/uL (ref 4.22–5.81)
RDW: 12.8 % (ref 11.5–15.5)
WBC Count: 6.4 10*3/uL (ref 4.0–10.5)
nRBC: 0 % (ref 0.0–0.2)

## 2018-09-17 LAB — CMP (CANCER CENTER ONLY)
ALBUMIN: 4.2 g/dL (ref 3.5–5.0)
ALT: 36 U/L (ref 0–44)
AST: 24 U/L (ref 15–41)
Alkaline Phosphatase: 51 U/L (ref 38–126)
Anion gap: 8 (ref 5–15)
BUN: 11 mg/dL (ref 6–20)
CHLORIDE: 104 mmol/L (ref 98–111)
CO2: 29 mmol/L (ref 22–32)
CREATININE: 1.01 mg/dL (ref 0.61–1.24)
Calcium: 10 mg/dL (ref 8.9–10.3)
GFR, Est AFR Am: >60 mL/min (ref >60)
GFR, Estimated: >60 mL/min (ref >60)
GLUCOSE: 98 mg/dL (ref 70–99)
POTASSIUM: 4.7 mmol/L (ref 3.5–5.1)
Sodium: 141 mmol/L (ref 135–145)
Total Bilirubin: 0.6 mg/dL (ref 0.3–1.2)
Total Protein: 6.9 g/dL (ref 6.5–8.1)

## 2018-09-17 MED ORDER — TEMOZOLOMIDE 100 MG PO CAPS: 200.0000 mg | ORAL_CAPSULE | Freq: Every day | ORAL | 0 refills | Status: AC

## 2018-09-17 MED ORDER — TEMOZOLOMIDE 140 MG PO CAPS: 140.0000 mg | ORAL_CAPSULE | Freq: Every day | ORAL | 0 refills | Status: AC

## 2018-09-17 NOTE — Research (Addendum)
WF 1801 3 Months Post RT:  09/17/18 8:45AM  Patient arrives today with his spouse [name] for his routine visit with Dr. Mickeal Skinner.  This RN saw him briefly to touch base.  The patient continues to take his Ramipril daily without issue, and confirms he has enough study drug supply.  He has not yet started his next cycle of Temodar.  This RN will call the patient in approximately 1 week to complete the 3 Month post RT visit and schedule his last in person visit for the WF1801 study.  The patient was thanked for his ongoing participation in the study.  Doreatha Martin, RN, BSN, [facility] 09/17/2018 8:52 AM
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
## 2018-09-17 NOTE — Telephone Encounter (Signed)
Printed avs and calender of upcoming appointment. Per 10/9 los 

## 2018-09-23 ENCOUNTER — Encounter: Payer: Self-pay | Admitting: *Deleted

## 2018-09-23 DIAGNOSIS — C712 Malignant neoplasm of temporal lobe: Secondary | ICD-10-CM

## 2018-09-23 NOTE — Research (Signed)
[id number] 3 Month Post RT Phone Call Visit  09/23/18 3:17 PM  [name] was contacted via phone by this RN.  Voicemail was left requesting a call back to complete 3 Month post RT visit; contact information provided.  09/23/18 4:18PM  [name] "[name]" returned this RN's called.  The following items were discussed:  Adverse Events and Concomitant Medications:  Patient reports that overall he is doing okay, he has recently started his next cycle of Temodar, and reports the following:  Temodar - Patient began dosing for his next cycle of temodar on 09/21/18.  He had one episode of emesis on 09/21/18, so he took his prescribed PRN zofran with relief in emesis.  He has had no further emesis, and no nausea.  This is a grade 1 vomiting occurrence.  He has not yet completed his current cycle of temodar dosing.  The patient reports updates on the following: ? URI - The patient states that he still coughs occasionally, but he has not had a noticeable cough in about one month.  He states, "I still cough every now and then." ? Blurred vision - no changes in his vision. ? Tension headaches - no change from baseline, occasional tension headache relieved with the use of PRN tylenol. ? Fatigue - the patient states that his fatigue is ongoing, no worse but no better than it was.  This continues at Grade 1. ? Dysuria (burning with urination) - patient reports today that the last time he remembers any burning with urination was approximately six weeks to two months ago.  The patient previously reported this as ongoing, but today confirms he does not have dysuria and has not for six weeks to two months.  Patient report of AE at last in person visit two months ago, on 07/31/18, was detailed with description; so end date of 08/12/18 (six week ago) is used for AE. ? Alopecia is ongoing, patient stating that his hair is slowly growing back.  This RN saw the patient on 09/17/18, and the patient's alopecia  has improved to a grade 1. ? Patient has had an elevated ALT since Baseline.  Recent lab results (routine labs drawn 09/17/18) show improvement and resolution of ALT to within normal limits.  The patient reports he has changed his diet reducing his sugar, fried food, and alcohol intake; trying to eat more protein and vegetables.   ? The patient does not report any intolerable grade 3 adverse effects; able to maintain dose of investigational ramipril at 2.5mg  daily, per protocol. ? The patient has decided against using the Optune device.  Adverse Event Table: Adverse Event Grade (CTCAE v5.0) Start Date End Date Relatedness(to Ramipril) Comments  Hepatobiliary disorder, other: Fatty liver 1 Baseline Ongoing NA; prior to study initiation Medical history, no intervention.  Alanine aminotransferase increased 1 Baseline Resolved 09/17/18 NA; prior to study initiation Baseline ALT 68 U/L;09/17/18 ALT 36 U/L  Aspartate aminotransferase increased 1 Baseline Resolved 05/30/18 NA; prior to study initiation   Blurred Visition 1 Baseline Ongoing NA; prior to study initiation Medical History  Arthritis 1 Baseline Ongoing NA; prior to study initiation Medical History; taking PRNnaproxen sodium  Dizziness 1 05/26/18 05/26/18 Unrelated Self resolved  Upper Respiratory Infection 2 05/28/18 Ongoing Unrelated 09/04/18 - intermittent cough ongoing  Headache 1 05/28/18 Resolved 06/27/18 Unrelated   Fatigue 1 05/28/18 08/21/18 Unrelated Worsened to Grade 2  Fatigue 2 08/22/18 Resolved 08/29/18 Unrelated Concurrent with temodar dosing; Unable to go to work, spent most of  the day resting; resolved to grade 1 after 08/29/18.  Fatigue 1 08/30/18 Ongoing Unrelated Relived byrest  Nausea 1 06/06/18 Resolved 07/08/18 Unrelated   Alopecia 1 06/11/18 07/09/18 Unrelated 07/09/18 - worsened to grade 2  Alopecia 2 07/09/18 09/17/18 Unrelated 09/17/18 - improving, returned to grade 1  Alopecia 1 09/17/18 Ongoing Unrelated    Myalgia 1 06/11/18 Resolved 07/02/18 Unrelated   Dysuria 1 06/25/18 Resolved 08/12/18 Unrelated Intermittent burning with urination,worse in morning  Flank pain 1 06/25/18 Resolved 07/02/18 Unrelated   Headache 2 08/22/18 Resolved 08/29/18 Unrelated Concurrent with temodar dosing; Unable to go to work, spent most of the day resting; resolved after 08/29/18.  Vomiting 1 09/21/18 Resolved 09/11/18 Unrelated One episode of emesis; concurrent with temodar dosing, relieved with use of PRN zofran.   Investigational Medication:  Patient reports that he has continued the two capsules, 2.5mg , daily nighttime dosing of investigational Ramipril since his last visit.  He has not missed any doses.  The patient has previously mailed in his medication diaries for August and September 2019 dosing, and the drug diaries confirm correct dosing.  The patient plans to mail in his October medication diary at the end of the month. The patient is instructed to continue dosing at maximum tolerated dose of 2.5mg , 2 capsules, QHS per protocol.  Patient verbalizes an understanding and denies any questions or concerns at this time  Visit Scheduling:  Patient has a routine visit scheduled with Dr. Mickeal Skinner on 10/15/18; Dr. Mickeal Skinner has also ordered the patient's routine follow up MRI around this time.  The patient's next study visit will be his final in person study visit at Week 22.  Patient is to bring his medication diaries, medication and medication bottles with him to the visit.  The patient will complete neurocognitive testing at the visit.  In order to complete a full 12 weeks of dosing with investigational Ramipril, patient is willing to come to a separate study visit on 10/21/18 to complete the Week 22 visit (physical exam 11/6).  This has been scheduled at a time convenient for the patient.   The patient was thanked for his ongoing participation in the WF1801 study.  Doreatha Martin, RN, BSN, [facility] 09/23/2018 4:52 PM

## 2018-10-09 ENCOUNTER — Other Ambulatory Visit: Payer: Self-pay | Admitting: Radiation Therapy

## 2018-10-10 ENCOUNTER — Ambulatory Visit
Admission: RE | Admit: 2018-10-10 | Discharge: 2018-10-10 | Disposition: A | Discharge status: Home / Self Care | Payer: PRIVATE HEALTH INSURANCE | Source: Ambulatory Visit | Attending: Internal Medicine | Admitting: Internal Medicine

## 2018-10-10 DIAGNOSIS — C712 Malignant neoplasm of temporal lobe: Secondary | ICD-10-CM

## 2018-10-10 MED ORDER — GADOBENATE DIMEGLUMINE 529 MG/ML IV SOLN
20.0000 mL | Freq: Once | INTRAVENOUS | Status: AC | PRN
  Administered 2018-10-10: 20 mL via INTRAVENOUS

## 2018-10-11 ENCOUNTER — Other Ambulatory Visit: Payer: Self-pay | Admitting: Internal Medicine

## 2018-10-13 ENCOUNTER — Other Ambulatory Visit: Payer: PRIVATE HEALTH INSURANCE

## 2018-10-14 NOTE — Progress Notes (Signed)
Brain and Spine Tumor Board Documentation  Barry Rios was presented by Cecil Cobbs, MD at Brain and Spine Tumor Board on 10/14/2018, which included representatives from neuro oncology, radiation oncology, surgical oncology, navigation, pathology, radiology.  Barry Rios was presented as a current patient with history of the following treatments:  .  Additionally, we reviewed previous medical and familial history, history of present illness, and recent lab results along with all available histopathologic and imaging studies. The tumor board considered available treatment options and made the following recommendations:  Chemotherapy    Tumor board is a meeting of clinicians from various specialty areas who evaluate and discuss patients for whom a multidisciplinary approach is being considered. Final determinations in the plan of care are those of the provider(s). The responsibility for follow up of recommendations given during tumor board is that of the provider.   Today's extended care, comprehensive team conference, [name] was not present for the discussion and was not examined.

## 2018-10-15 ENCOUNTER — Telehealth: Payer: Self-pay

## 2018-10-15 ENCOUNTER — Inpatient Hospital Stay: Payer: PRIVATE HEALTH INSURANCE | Attending: Internal Medicine | Admitting: Internal Medicine

## 2018-10-15 ENCOUNTER — Inpatient Hospital Stay: Payer: PRIVATE HEALTH INSURANCE

## 2018-10-15 VITALS — BP 113/73 | HR 69 | Temp 98.4°F | Resp 17 | Ht 71.25 in | Wt 242.6 lb

## 2018-10-15 DIAGNOSIS — Z87442 Personal history of urinary calculi: Secondary | ICD-10-CM | POA: Diagnosis not present

## 2018-10-15 DIAGNOSIS — C712 Malignant neoplasm of temporal lobe: Secondary | ICD-10-CM

## 2018-10-15 DIAGNOSIS — Z9221 Personal history of antineoplastic chemotherapy: Secondary | ICD-10-CM | POA: Diagnosis not present

## 2018-10-15 DIAGNOSIS — I10 Essential (primary) hypertension: Secondary | ICD-10-CM | POA: Diagnosis not present

## 2018-10-15 DIAGNOSIS — F418 Other specified anxiety disorders: Secondary | ICD-10-CM | POA: Diagnosis not present

## 2018-10-15 DIAGNOSIS — M129 Arthropathy, unspecified: Secondary | ICD-10-CM | POA: Diagnosis not present

## 2018-10-15 DIAGNOSIS — Z7982 Long term (current) use of aspirin: Secondary | ICD-10-CM | POA: Diagnosis not present

## 2018-10-15 DIAGNOSIS — R112 Nausea with vomiting, unspecified: Secondary | ICD-10-CM | POA: Diagnosis not present

## 2018-10-15 DIAGNOSIS — Z79899 Other long term (current) drug therapy: Secondary | ICD-10-CM | POA: Diagnosis not present

## 2018-10-15 DIAGNOSIS — R5383 Other fatigue: Secondary | ICD-10-CM | POA: Insufficient documentation

## 2018-10-15 DIAGNOSIS — K760 Fatty (change of) liver, not elsewhere classified: Secondary | ICD-10-CM | POA: Diagnosis not present

## 2018-10-15 DIAGNOSIS — Z87891 Personal history of nicotine dependence: Secondary | ICD-10-CM | POA: Diagnosis not present

## 2018-10-15 LAB — CBC WITH DIFFERENTIAL (CANCER CENTER ONLY)
ABS IMMATURE GRANULOCYTES: 0.02 10*3/uL (ref 0.00–0.07)
Basophils Absolute: 0.1 10*3/uL (ref 0.0–0.1)
Basophils Relative: 1 %
Eosinophils Absolute: 0.2 10*3/uL (ref 0.0–0.5)
Eosinophils Relative: 3 %
HCT: 46 % (ref 39.0–52.0)
HEMOGLOBIN: 15.7 g/dL (ref 13.0–17.0)
Immature Granulocytes: 0 %
LYMPHS PCT: 18 %
Lymphs Abs: 1 10*3/uL (ref 0.7–4.0)
MCH: 30.8 pg (ref 26.0–34.0)
MCHC: 34.1 g/dL (ref 30.0–36.0)
MCV: 90.4 fL (ref 80.0–100.0)
MONO ABS: 0.4 10*3/uL (ref 0.1–1.0)
Monocytes Relative: 7 %
NEUTROS ABS: 3.9 10*3/uL (ref 1.7–7.7)
Neutrophils Relative %: 71 %
Platelet Count: 581 10*3/uL — ABNORMAL HIGH (ref 150–400)
RBC: 5.09 MIL/uL (ref 4.22–5.81)
RDW: 13.2 % (ref 11.5–15.5)
WBC Count: 5.4 10*3/uL (ref 4.0–10.5)
nRBC: 0 % (ref 0.0–0.2)

## 2018-10-15 LAB — CMP (CANCER CENTER ONLY)
ALT: 46 U/L — AB (ref 0–44)
AST: 26 U/L (ref 15–41)
Albumin: 4.1 g/dL (ref 3.5–5.0)
Alkaline Phosphatase: 55 U/L (ref 38–126)
Anion gap: 9 (ref 5–15)
BILIRUBIN TOTAL: 0.6 mg/dL (ref 0.3–1.2)
BUN: 11 mg/dL (ref 6–20)
CO2: 27 mmol/L (ref 22–32)
Calcium: 9.6 mg/dL (ref 8.9–10.3)
Chloride: 103 mmol/L (ref 98–111)
Creatinine: 1 mg/dL (ref 0.61–1.24)
GLUCOSE: 110 mg/dL — AB (ref 70–99)
Potassium: 4.1 mmol/L (ref 3.5–5.1)
Sodium: 139 mmol/L (ref 135–145)
Total Protein: 6.9 g/dL (ref 6.5–8.1)

## 2018-10-15 LAB — VITAMIN B12: Vitamin B-12: 287 pg/mL (ref 180–914)

## 2018-10-15 LAB — CORTISOL: Cortisol, Plasma: 13.8 ug/dL

## 2018-10-15 LAB — TSH: TSH: 0.936 u[IU]/mL (ref 0.320–4.118)

## 2018-10-15 MED ORDER — TEMOZOLOMIDE 250 MG PO CAPS: 250.0000 mg | ORAL_CAPSULE | Freq: Every day | ORAL | 0 refills | Status: DC

## 2018-10-15 MED ORDER — TEMOZOLOMIDE 180 MG PO CAPS: 180.0000 mg | ORAL_CAPSULE | Freq: Every day | ORAL | 0 refills | Status: DC

## 2018-10-15 NOTE — Telephone Encounter (Signed)
Printed avs and calender of upcoming appointment. Per 1/16 los 
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
## 2018-10-16 LAB — TESTOSTERONE: TESTOSTERONE: 399 ng/dL (ref 264–916)

## 2018-10-16 LAB — VITAMIN D 25 HYDROXY (VIT D DEFICIENCY, FRACTURES): Vit D, 25-Hydroxy: 26.6 ng/mL — ABNORMAL LOW (ref 30.0–100.0)

## 2018-10-20 ENCOUNTER — Telehealth: Payer: Self-pay | Admitting: *Deleted

## 2018-10-20 NOTE — Telephone Encounter (Signed)
Received request from [facility] for Case Management review and also requested results of MRI to be faxed to Case management Review to [phone] to [facility] RN

## 2018-10-20 NOTE — Telephone Encounter (Signed)
[id number] 4 Month Post RT Visit Reminder  10/20/18 4:09 PM  This RN called and reminded [name] "[name]" of his visit tomorrow.  Reminded patient that tonight will be his last dose of the investigational Ramipril.  Requested patient to bring all of the medication bottles, remaining investigational ramipril, and medication diaries to the visit tomorrow.  Patient mentioned that he may have thrown away a medication bottle once it was empty.  This RN requested patient to bring all that he had remaining.  Patient is in agreement with visit tomorrow. Doreatha Martin, RN, BSN, [facility] 10/20/2018 4:10 PM

## 2018-10-21 ENCOUNTER — Inpatient Hospital Stay: Payer: PRIVATE HEALTH INSURANCE | Admitting: *Deleted

## 2018-10-21 VITALS — BP 133/88 | HR 90 | Temp 98.5°F | Resp 16

## 2018-10-21 DIAGNOSIS — C712 Malignant neoplasm of temporal lobe: Secondary | ICD-10-CM

## 2018-10-21 NOTE — Research (Addendum)
[id number] 4 Month Post RT Visit  10/21/18 1:17PM  [name] "[name]" arrives today with his spouse for the purpose of the WF1801 4 Month post RT visit.    Physical Exam - The patient has already seen Dr. Mickeal Skinner during this visit window (on 10/15/18) for his routine physical exam.  Please see documentation from that encounter for Dr. Renda Rolls assessment.  Vital Signs - The patient's blood pressure and heart rate were collected today according to protocol requirements by this RN. The patient was seated for a full five minutes prior to the collection of these vital signs; documented in this encounter.  The patient's weight was collected at the time of the encounter with Dr. Mickeal Skinner; see 10/15/18 encounter for documentation.  MRI - The patient has his routine MRI of the brain completed on 10/10/2018.  Medical History, Concomitant Medications, and Adverse Events - The patient's medical history was reviewed by this RN with the patient today and the patient reports no changes in his medical history.   The patient also reports no changes in concomitant medications.  The patient continues to decline use of the Optune device.  The patient completed his course of temodar on 09/25/18 (started 09/21/18)  Overall the patient is "doing good."  In regards the Adverse Events the patient reports the following:  Irritability -  The patient's spouse mentions that the patient has become more irritable over the course of the last three months, and the patient agreed that he has been more "testy" and that he finds loud noises to be especially irritating.  The patient has even occasionally noted a quick painful sensation at his left temple with loud noises that will immediately go away after it occurs.  The patient states that his hearing seems to be hightened and this was contributing to the irritability.  This has not impacted the patient's day to day activities.  The patient has not tried any interventions to  alleviate the irritability.  URI - The patient reports this is unchanged.  Blurred Vision - no changes from baseline.  Tension Headaches - no change from baseline.  Fatigue - no change in fatigue since most recent phone call with this RN (continues at Grade 1)  Alopecia - improved, that is no longer noticeable from a distance, but is still evident upon close inspection.  ALT - patient's ALT is again elevated, return to Baseline (grade 1)  The patient does not report any intolerable grade 3 adverse effects.  Adverse Event Table: Adverse Event Grade (CTCAE v5.0) Start Date End Date Relatedness(to Ramipril) Comments  Hepatobiliary disorder, other: Fatty liver 1 Baseline Ongoing NA; prior to study initiation Medical history, no intervention.  Alanine aminotransferase increased 1 Baseline Resolved 09/17/18 NA; prior to study initiation Baseline ALT 68 U/L;09/17/18 ALT 36 U/L  Alanine aminotransferase increased 1 10/15/18 Ongoing Unrelated Return to Baseline  Aspartate aminotransferase increased 1 Baseline Resolved 05/30/18 NA; prior to study initiation   Blurred Visition 1 Baseline Ongoing NA; prior to study initiation Medical History  Arthritis 1 Baseline Ongoing NA; prior to study initiation Medical History; taking PRNnaproxen sodium  Dizziness 1 05/26/18 05/26/18 Unrelated Self resolved  Upper Respiratory Infection 2 05/28/18 Ongoing Unrelated 10/21/18 - intermittent, mild, ongoing  Headache 1 05/28/18 Resolved 06/27/18 Unrelated   Fatigue 1 05/28/18 08/21/18 Unrelated Worsened to Grade 2  Fatigue 2 08/22/18 Resolved 08/29/18 Unrelated Concurrent with temodar dosing; Unable to go to work, spent most of the day resting; resolved to grade 1 after 08/29/18.  Fatigue 1 08/30/18 Ongoing Unrelated Relived byrest  Nausea 1 06/06/18 Resolved 07/08/18 Unrelated   Alopecia 1 06/11/18 07/09/18 Unrelated 07/09/18 - worsened to grade 2  Alopecia 2 07/09/18 09/17/18 Unrelated 09/17/18 - improving,  returned to grade 1  Alopecia 1 09/17/18 Ongoing Unrelated   Myalgia 1 06/11/18 Resolved 07/02/18 Unrelated   Dysuria 1 06/25/18 Resolved 08/12/18 Unrelated Intermittent burning with urination,worse in morning  Flank pain 1 06/25/18 Resolved 07/02/18 Unrelated   Headache 2 08/22/18 Resolved 08/29/18 Unrelated Concurrent with temodar dosing; Unable to go to work, spent most of the day resting; resolved after 08/29/18.  Vomiting 1 09/21/18 Resolved 09/11/18 Unrelated One episode of emesis; concurrent with temodar dosing, relieved with use of PRN zofran  Irritability 1 07/21/18 Ongoing Unrelated    Investigational Medication -  Patient reports that he has continued the two capsules, 2.5mg , daily nighttime dosing of investigational Ramipril since his last visit.  The patient's last dose of medication was last night,  10/20/18.  The patient returns bottles 1/4, 2/4, 3/4, and 4/4 of bottle [id number].  The patient also returns the medication diaries for October and November.  An error noted on a previous medication diary was reviewed with the patient and corrected.  The patient has completed 22 weeks of dosing.  The patient understands that his dosing is complete; but that his treatment with temodar will be ongoing as directed by Dr. Mickeal Skinner. This RN delivered the medication bottles and remaining investigational Ramipril to the pharmacy.  The patient had a final pill count of 12; a pill count of 16 was expected.  The patient states that he may have the two capsules in his weekly pill container at home.  The patient does not recall taking any extra capsules or losing any capsules.  If the patient locates the capsules, then he will not take these but notify this RN and rather bring them to the [facility] at his next routinely scheduled visit with Dr. Mickeal Skinner.  Neurocognitive Testing and Questionnaires - Neurocognitive testing was administered by Farris Has; the patient's spouse and this RN left the room for the  duration of the neurocognitive tests.  Judeen Hammans also provided the patient with the study required questionnaires.   This RN reviewed only for completeness.  Visit Scheduling - The patient understands that this was his last in person visit in the WF1801 study.  This RN will call the patient for follow up in approximately one month.  The patient was thanked for his participation in the WF1801 study.  Doreatha Martin, RN, BSN, [facility] 10/21/2018 3:11 PM

## 2018-10-22 ENCOUNTER — Telehealth: Payer: Self-pay | Admitting: *Deleted

## 2018-10-22 NOTE — Telephone Encounter (Signed)
10/22/18 2:21 PM  This RN called [name].  Instructed patient that he is able to get his annual flu shot this year.  Patient verbalized understanding and thanks for the information.  [id number] - Patient located the four investigational Ramipril tablets in his weekly medication box at home.  He will place these in a bag and bring to this next clinic visit with Dr. Mickeal Skinner on 11/12/18.    Doreatha Martin, RN, BSN, [facility] 10/22/2018 2:23 PM

## 2018-11-11 ENCOUNTER — Other Ambulatory Visit: Payer: Self-pay | Admitting: Internal Medicine

## 2018-11-11 DIAGNOSIS — C712 Malignant neoplasm of temporal lobe: Secondary | ICD-10-CM

## 2018-11-12 ENCOUNTER — Inpatient Hospital Stay: Payer: PRIVATE HEALTH INSURANCE | Attending: Internal Medicine

## 2018-11-12 ENCOUNTER — Inpatient Hospital Stay (HOSPITAL_BASED_OUTPATIENT_CLINIC_OR_DEPARTMENT_OTHER): Payer: PRIVATE HEALTH INSURANCE | Admitting: Internal Medicine

## 2018-11-12 ENCOUNTER — Telehealth: Payer: Self-pay | Admitting: Internal Medicine

## 2018-11-12 VITALS — BP 126/86 | HR 74 | Temp 98.0°F | Resp 17 | Ht 71.25 in | Wt 242.7 lb

## 2018-11-12 DIAGNOSIS — R5383 Other fatigue: Secondary | ICD-10-CM | POA: Insufficient documentation

## 2018-11-12 DIAGNOSIS — C712 Malignant neoplasm of temporal lobe: Secondary | ICD-10-CM

## 2018-11-12 DIAGNOSIS — Z87442 Personal history of urinary calculi: Secondary | ICD-10-CM | POA: Insufficient documentation

## 2018-11-12 DIAGNOSIS — I10 Essential (primary) hypertension: Secondary | ICD-10-CM | POA: Insufficient documentation

## 2018-11-12 DIAGNOSIS — M129 Arthropathy, unspecified: Secondary | ICD-10-CM | POA: Diagnosis not present

## 2018-11-12 DIAGNOSIS — Z9221 Personal history of antineoplastic chemotherapy: Secondary | ICD-10-CM | POA: Diagnosis not present

## 2018-11-12 DIAGNOSIS — F329 Major depressive disorder, single episode, unspecified: Secondary | ICD-10-CM | POA: Insufficient documentation

## 2018-11-12 DIAGNOSIS — Z79899 Other long term (current) drug therapy: Secondary | ICD-10-CM | POA: Insufficient documentation

## 2018-11-12 DIAGNOSIS — Z87891 Personal history of nicotine dependence: Secondary | ICD-10-CM

## 2018-11-12 LAB — CBC WITH DIFFERENTIAL (CANCER CENTER ONLY)
Abs Immature Granulocytes: 0.01 10*3/uL (ref 0.00–0.07)
BASOS ABS: 0.1 10*3/uL (ref 0.0–0.1)
BASOS PCT: 1 %
Eosinophils Absolute: 0.3 10*3/uL (ref 0.0–0.5)
Eosinophils Relative: 5 %
HCT: 44 % (ref 39.0–52.0)
HEMOGLOBIN: 15.2 g/dL (ref 13.0–17.0)
IMMATURE GRANULOCYTES: 0 %
LYMPHS PCT: 17 %
Lymphs Abs: 1 10*3/uL (ref 0.7–4.0)
MCH: 31.1 pg (ref 26.0–34.0)
MCHC: 34.5 g/dL (ref 30.0–36.0)
MCV: 90.2 fL (ref 80.0–100.0)
MONO ABS: 0.5 10*3/uL (ref 0.1–1.0)
Monocytes Relative: 8 %
NEUTROS ABS: 4 10*3/uL (ref 1.7–7.7)
NEUTROS PCT: 69 %
PLATELETS: 549 10*3/uL — AB (ref 150–400)
RBC: 4.88 MIL/uL (ref 4.22–5.81)
RDW: 13.8 % (ref 11.5–15.5)
WBC: 5.8 10*3/uL (ref 4.0–10.5)
nRBC: 0 % (ref 0.0–0.2)

## 2018-11-12 LAB — CMP (CANCER CENTER ONLY)
ALBUMIN: 4.1 g/dL (ref 3.5–5.0)
ALK PHOS: 59 U/L (ref 38–126)
ALT: 40 U/L (ref 0–44)
AST: 27 U/L (ref 15–41)
Anion gap: 8 (ref 5–15)
BUN: 11 mg/dL (ref 6–20)
CHLORIDE: 106 mmol/L (ref 98–111)
CO2: 27 mmol/L (ref 22–32)
CREATININE: 1.11 mg/dL (ref 0.61–1.24)
Calcium: 9.6 mg/dL (ref 8.9–10.3)
GFR, Estimated: >60 mL/min (ref >60)
GLUCOSE: 95 mg/dL (ref 70–99)
Potassium: 4 mmol/L (ref 3.5–5.1)
SODIUM: 141 mmol/L (ref 135–145)
TOTAL PROTEIN: 6.8 g/dL (ref 6.5–8.1)
Total Bilirubin: 0.5 mg/dL (ref 0.3–1.2)

## 2018-11-12 MED ORDER — DOCUSATE SODIUM 100 MG PO CAPS: 100.0000 mg | ORAL_CAPSULE | Freq: Every day | ORAL | 3 refills | Status: DC

## 2018-11-12 MED ORDER — TEMOZOLOMIDE 180 MG PO CAPS: 180.0000 mg | ORAL_CAPSULE | Freq: Every day | ORAL | 0 refills | Status: DC

## 2018-11-12 MED ORDER — TEMOZOLOMIDE 250 MG PO CAPS: 250.0000 mg | ORAL_CAPSULE | Freq: Every day | ORAL | 0 refills | Status: DC

## 2018-11-12 MED ORDER — VITAMIN D 1000 UNITS PO TABS: 1000.0000 [IU] | ORAL_TABLET | Freq: Every day | ORAL | 3 refills | Status: AC
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
## 2018-11-12 NOTE — Telephone Encounter (Signed)
Printed calendar and avs. °

## 2018-11-21 ENCOUNTER — Telehealth: Payer: Self-pay | Admitting: *Deleted

## 2018-11-21 NOTE — Telephone Encounter (Signed)
[id number] - Five Months Post RT Phone Call  11/21/18 4:05 PM  This RN left a voicemail for [name] "Barry Rios" requesting call back for month 5 phone call.  Left contact information.  This RN will attempt contact again. Doreatha Martin, RN, BSN, [facility] 11/21/2018 4:06 PM

## 2018-11-24 NOTE — Telephone Encounter (Addendum)
[id number] - 5 Month Post RT, Week 26, Phone Call  11/24/18 2:32PM  This RN called [name] "Barry Rios," and discussed the following:  Patient has been doing well since completion of investigational ramipril.  He has experienced some side effects during temodar cycles (see below), but all are well managed.    Adverse Events and Concomitant Medications -   Vitamin D deficiency was noted on patient's routine labs on 10/15/2018.  Patient has since started taking Vitamin D 1000 units daily, beginning on 11/12/18.  Grade 2, as this requires minimal intervention.  Constipation - Patient reports experiencing mild constipation during his cycle of temodar in November.  He did not require the use of colace, as has not taken any colace doses as of today.  Patient reports that his hair has grown back; alopecia is resolved.  URI - Patient experienced a "three day cold," approximately 12/6-8/19, with URI symptoms including the ongoing cough, and then had a complete resolution of URI symptoms.  No further cough or URI symptoms since 11/16/18.  Patient reports no changes to: fatigue, irritability, baseline tension headaches, baseline blurred vision.    Patient's most recent ALT measurement has returned to within normal limits, so this has resolved.  Temodar dosing - patient took his cycle of temozolomide 12/9-13/2019; prior cycle completed approximately 11/13-17/2019.  Patient had his annual flu vaccine, on approximately 11/10/18, given at his primary care providers practice.  Adverse Event Table: Adverse Event Grade (CTCAE v5.0) Start Date End Date Relatedness(to Ramipril) Comments  Hepatobiliary disorder, other: Fatty liver 1 Baseline Ongoing NA; prior to study initiation Medical history, no intervention.  Alanine aminotransferase increased 1 Baseline Resolved 09/17/18 NA; prior to study initiation Baseline ALT 68 U/L;09/17/18 ALT 36 U/L  Alanine aminotransferase increased 1 10/15/18 11/12/18 Unrelated    Aspartate aminotransferase increased 1 Baseline Resolved 05/30/18 NA; prior to study initiation   Blurred Visition 1 Baseline Ongoing NA; prior to study initiation Medical History  Arthritis 1 Baseline Ongoing NA; prior to study initiation Medical History; taking PRNnaproxen sodium  Dizziness 1 05/26/18 05/26/18 Unrelated Self resolved  Upper Respiratory Infection 2 05/28/18 11/16/18 Unrelated   Headache 1 05/28/18 Resolved 06/27/18 Unrelated   Fatigue 1 05/28/18 08/21/18 Unrelated Worsened to Grade 2  Fatigue 2 08/22/18 Resolved 08/29/18 Unrelated Concurrent with temodar dosing; Unable to go to work, spent most of the day resting; resolved to grade 1 after 08/29/18.  Fatigue 1 08/30/18 Ongoing Unrelated Relived byrest  Nausea 1 06/06/18 Resolved 07/08/18 Unrelated   Alopecia 1 06/11/18 07/09/18 Unrelated 07/09/18 - worsened to grade 2  Alopecia 2 07/09/18 09/17/18 Unrelated 09/17/18 - improving, returned to grade 1  Alopecia 1 09/17/18 11/24/18 Unrelated   Myalgia 1 06/11/18 Resolved 07/02/18 Unrelated   Dysuria 1 06/25/18 Resolved 08/12/18 Unrelated Intermittent burning with urination,worse in morning  Flank pain 1 06/25/18 Resolved 07/02/18 Unrelated   Headache 2 08/22/18 Resolved 08/29/18 Unrelated Concurrent with temodar dosing; Unable to go to work, spent most of the day resting; resolved after 08/29/18.  Vomiting 1 09/21/18 Resolved 09/11/18 Unrelated One episode of emesis; concurrent with temodar dosing, relieved with use of PRN zofran  Irritability 1 07/21/18 Ongoing Unrelated   Investigations, other: Vitamin D Deficiency 1 10/15/18 Ongoing Unrelated Initiated cholecalciferol 1000 unites PO QD.  Constipation 1 10/22/18 10/26/18 Unrelated     Patient will continue to see Dr. Mickeal Rios for his routine care for glioblastoma.  The patient was thanked for his participation in the [id number] trial.  Doreatha Martin, RN, BSN,  [place] 11/24/2018 3:22 PM

## 2018-12-04 ENCOUNTER — Other Ambulatory Visit: Payer: Self-pay | Admitting: Internal Medicine

## 2018-12-08 ENCOUNTER — Other Ambulatory Visit: Payer: Self-pay | Admitting: Radiation Therapy

## 2018-12-08 ENCOUNTER — Other Ambulatory Visit: Payer: PRIVATE HEALTH INSURANCE

## 2018-12-12 ENCOUNTER — Encounter: Payer: Self-pay | Admitting: Internal Medicine

## 2018-12-12 ENCOUNTER — Ambulatory Visit
Admission: RE | Admit: 2018-12-12 | Discharge: 2018-12-12 | Disposition: A | Discharge status: Home / Self Care | Payer: PRIVATE HEALTH INSURANCE | Source: Ambulatory Visit | Attending: Internal Medicine | Admitting: Internal Medicine

## 2018-12-12 DIAGNOSIS — C712 Malignant neoplasm of temporal lobe: Secondary | ICD-10-CM

## 2018-12-12 MED ORDER — GADOBENATE DIMEGLUMINE 529 MG/ML IV SOLN
20.0000 mL | Freq: Once | INTRAVENOUS | Status: AC | PRN
  Administered 2018-12-12: 20 mL via INTRAVENOUS

## 2018-12-12 NOTE — Progress Notes (Signed)
Pt's wife called inquiring about financial assistance.  After reviewing pt's chart he's not on infused chemotherapy but unfortunately there aren't any foundations offering copay assistance for his Dx.  She stated he's on the chemo pill and thought he was getting assistance for that already.  I advised she call customer service to go over their options which is to be set up on a payment plan or apply for the [employer] card and I gave her the number.  I also informed her of the [employer] program but as of right now he doesn't qualify for that because his balance has to be $5000 or greater.  She verbalized understanding and was very Patent attorney.

## 2018-12-18 ENCOUNTER — Telehealth: Payer: Self-pay | Admitting: Internal Medicine

## 2018-12-18 ENCOUNTER — Encounter: Payer: Self-pay | Admitting: Internal Medicine

## 2018-12-18 ENCOUNTER — Inpatient Hospital Stay: Payer: PRIVATE HEALTH INSURANCE | Attending: Internal Medicine

## 2018-12-18 ENCOUNTER — Inpatient Hospital Stay (HOSPITAL_BASED_OUTPATIENT_CLINIC_OR_DEPARTMENT_OTHER): Payer: PRIVATE HEALTH INSURANCE | Admitting: Internal Medicine

## 2018-12-18 VITALS — BP 130/86 | HR 66 | Temp 98.6°F | Resp 20 | Ht 71.25 in | Wt 245.6 lb

## 2018-12-18 DIAGNOSIS — M129 Arthropathy, unspecified: Secondary | ICD-10-CM

## 2018-12-18 DIAGNOSIS — R5383 Other fatigue: Secondary | ICD-10-CM

## 2018-12-18 DIAGNOSIS — Z79899 Other long term (current) drug therapy: Secondary | ICD-10-CM | POA: Diagnosis not present

## 2018-12-18 DIAGNOSIS — Z87891 Personal history of nicotine dependence: Secondary | ICD-10-CM | POA: Diagnosis not present

## 2018-12-18 DIAGNOSIS — I10 Essential (primary) hypertension: Secondary | ICD-10-CM | POA: Insufficient documentation

## 2018-12-18 DIAGNOSIS — Z87442 Personal history of urinary calculi: Secondary | ICD-10-CM

## 2018-12-18 DIAGNOSIS — C712 Malignant neoplasm of temporal lobe: Secondary | ICD-10-CM

## 2018-12-18 DIAGNOSIS — Z9221 Personal history of antineoplastic chemotherapy: Secondary | ICD-10-CM | POA: Insufficient documentation

## 2018-12-18 DIAGNOSIS — R112 Nausea with vomiting, unspecified: Secondary | ICD-10-CM | POA: Diagnosis not present

## 2018-12-18 DIAGNOSIS — R51 Headache: Secondary | ICD-10-CM

## 2018-12-18 DIAGNOSIS — F329 Major depressive disorder, single episode, unspecified: Secondary | ICD-10-CM | POA: Insufficient documentation

## 2018-12-18 LAB — CBC WITH DIFFERENTIAL (CANCER CENTER ONLY)
Abs Immature Granulocytes: 0.02 10*3/uL (ref 0.00–0.07)
Basophils Absolute: 0.1 10*3/uL (ref 0.0–0.1)
Basophils Relative: 1 %
Eosinophils Absolute: 0.3 10*3/uL (ref 0.0–0.5)
Eosinophils Relative: 4 %
HCT: 46.5 % (ref 39.0–52.0)
Hemoglobin: 16.3 g/dL (ref 13.0–17.0)
Immature Granulocytes: 0 %
Lymphocytes Relative: 16 %
Lymphs Abs: 1 10*3/uL (ref 0.7–4.0)
MCH: 32 pg (ref 26.0–34.0)
MCHC: 35.1 g/dL (ref 30.0–36.0)
MCV: 91.4 fL (ref 80.0–100.0)
Monocytes Absolute: 0.6 10*3/uL (ref 0.1–1.0)
Monocytes Relative: 9 %
Neutro Abs: 4.5 10*3/uL (ref 1.7–7.7)
Neutrophils Relative %: 70 %
Platelet Count: 446 10*3/uL — ABNORMAL HIGH (ref 150–400)
RBC: 5.09 MIL/uL (ref 4.22–5.81)
RDW: 13.4 % (ref 11.5–15.5)
WBC Count: 6.5 10*3/uL (ref 4.0–10.5)
nRBC: 0 % (ref 0.0–0.2)

## 2018-12-18 LAB — CMP (CANCER CENTER ONLY)
ALT: 66 U/L — ABNORMAL HIGH (ref 0–44)
ANION GAP: 9 (ref 5–15)
AST: 40 U/L (ref 15–41)
Albumin: 4.2 g/dL (ref 3.5–5.0)
Alkaline Phosphatase: 57 U/L (ref 38–126)
BUN: 9 mg/dL (ref 6–20)
CHLORIDE: 103 mmol/L (ref 98–111)
CO2: 28 mmol/L (ref 22–32)
Calcium: 9.9 mg/dL (ref 8.9–10.3)
Creatinine: 1.08 mg/dL (ref 0.61–1.24)
GFR, Est AFR Am: >60 mL/min (ref >60)
Glucose, Bld: 100 mg/dL — ABNORMAL HIGH (ref 70–99)
POTASSIUM: 4.7 mmol/L (ref 3.5–5.1)
SODIUM: 140 mmol/L (ref 135–145)
Total Bilirubin: 0.7 mg/dL (ref 0.3–1.2)
Total Protein: 7 g/dL (ref 6.5–8.1)

## 2018-12-18 NOTE — Telephone Encounter (Signed)
Printed calendar and avs. °
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
## 2018-12-25 ENCOUNTER — Other Ambulatory Visit: Payer: Self-pay | Admitting: Internal Medicine

## 2018-12-30 ENCOUNTER — Other Ambulatory Visit: Payer: Self-pay | Admitting: Pharmacist

## 2019-01-15 ENCOUNTER — Inpatient Hospital Stay: Payer: PRIVATE HEALTH INSURANCE | Attending: Internal Medicine

## 2019-01-15 ENCOUNTER — Inpatient Hospital Stay (HOSPITAL_BASED_OUTPATIENT_CLINIC_OR_DEPARTMENT_OTHER): Payer: PRIVATE HEALTH INSURANCE | Admitting: Internal Medicine

## 2019-01-15 ENCOUNTER — Telehealth: Payer: Self-pay

## 2019-01-15 ENCOUNTER — Encounter: Payer: Self-pay | Admitting: Internal Medicine

## 2019-01-15 VITALS — BP 135/86 | HR 66 | Temp 98.3°F | Resp 17 | Ht 71.25 in | Wt 248.4 lb

## 2019-01-15 DIAGNOSIS — I10 Essential (primary) hypertension: Secondary | ICD-10-CM | POA: Diagnosis not present

## 2019-01-15 DIAGNOSIS — Z9221 Personal history of antineoplastic chemotherapy: Secondary | ICD-10-CM | POA: Diagnosis not present

## 2019-01-15 DIAGNOSIS — C712 Malignant neoplasm of temporal lobe: Secondary | ICD-10-CM | POA: Insufficient documentation

## 2019-01-15 DIAGNOSIS — R112 Nausea with vomiting, unspecified: Secondary | ICD-10-CM

## 2019-01-15 DIAGNOSIS — Z87891 Personal history of nicotine dependence: Secondary | ICD-10-CM | POA: Insufficient documentation

## 2019-01-15 DIAGNOSIS — Z79899 Other long term (current) drug therapy: Secondary | ICD-10-CM | POA: Diagnosis not present

## 2019-01-15 LAB — CMP (CANCER CENTER ONLY)
ALT: 62 U/L — AB (ref 0–44)
AST: 34 U/L (ref 15–41)
Albumin: 4.3 g/dL (ref 3.5–5.0)
Alkaline Phosphatase: 58 U/L (ref 38–126)
Anion gap: 9 (ref 5–15)
BUN: 11 mg/dL (ref 6–20)
CHLORIDE: 105 mmol/L (ref 98–111)
CO2: 28 mmol/L (ref 22–32)
Calcium: 9.7 mg/dL (ref 8.9–10.3)
Creatinine: 0.99 mg/dL (ref 0.61–1.24)
GFR, Estimated: >60 mL/min (ref >60)
GLUCOSE: 92 mg/dL (ref 70–99)
Potassium: 4.3 mmol/L (ref 3.5–5.1)
SODIUM: 142 mmol/L (ref 135–145)
Total Bilirubin: 0.8 mg/dL (ref 0.3–1.2)
Total Protein: 6.7 g/dL (ref 6.5–8.1)

## 2019-01-15 LAB — CBC WITH DIFFERENTIAL (CANCER CENTER ONLY)
ABS IMMATURE GRANULOCYTES: 0.01 10*3/uL (ref 0.00–0.07)
BASOS ABS: 0.1 10*3/uL (ref 0.0–0.1)
Basophils Relative: 2 %
Eosinophils Absolute: 0.2 10*3/uL (ref 0.0–0.5)
Eosinophils Relative: 4 %
HEMATOCRIT: 46.4 % (ref 39.0–52.0)
HEMOGLOBIN: 15.9 g/dL (ref 13.0–17.0)
IMMATURE GRANULOCYTES: 0 %
LYMPHS ABS: 1.2 10*3/uL (ref 0.7–4.0)
LYMPHS PCT: 21 %
MCH: 31.2 pg (ref 26.0–34.0)
MCHC: 34.3 g/dL (ref 30.0–36.0)
MCV: 91 fL (ref 80.0–100.0)
MONOS PCT: 9 %
Monocytes Absolute: 0.5 10*3/uL (ref 0.1–1.0)
NEUTROS PCT: 64 %
Neutro Abs: 3.7 10*3/uL (ref 1.7–7.7)
Platelet Count: 566 10*3/uL — ABNORMAL HIGH (ref 150–400)
RBC: 5.1 MIL/uL (ref 4.22–5.81)
RDW: 13 % (ref 11.5–15.5)
WBC Count: 5.7 10*3/uL (ref 4.0–10.5)
nRBC: 0 % (ref 0.0–0.2)

## 2019-01-15 NOTE — Telephone Encounter (Signed)
Printed avs and calender of upcoming appointment. Per 2/6 los 
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
## 2019-01-26 ENCOUNTER — Other Ambulatory Visit: Payer: Self-pay | Admitting: Internal Medicine

## 2019-02-05 ENCOUNTER — Other Ambulatory Visit: Payer: Self-pay | Admitting: Radiation Therapy

## 2019-02-06 ENCOUNTER — Ambulatory Visit
Admission: RE | Admit: 2019-02-06 | Discharge: 2019-02-06 | Disposition: A | Discharge status: Home / Self Care | Payer: PRIVATE HEALTH INSURANCE | Source: Ambulatory Visit | Attending: Internal Medicine | Admitting: Internal Medicine

## 2019-02-06 DIAGNOSIS — C712 Malignant neoplasm of temporal lobe: Secondary | ICD-10-CM

## 2019-02-06 MED ORDER — GADOBENATE DIMEGLUMINE 529 MG/ML IV SOLN
20.0000 mL | Freq: Once | INTRAVENOUS | Status: AC | PRN
  Administered 2019-02-06: 20 mL via INTRAVENOUS

## 2019-02-13 ENCOUNTER — Other Ambulatory Visit: Payer: Self-pay

## 2019-02-13 ENCOUNTER — Inpatient Hospital Stay: Payer: PRIVATE HEALTH INSURANCE

## 2019-02-13 ENCOUNTER — Inpatient Hospital Stay: Payer: PRIVATE HEALTH INSURANCE | Attending: Internal Medicine | Admitting: Internal Medicine

## 2019-02-13 ENCOUNTER — Telehealth: Payer: Self-pay | Admitting: Internal Medicine

## 2019-02-13 VITALS — BP 133/80 | HR 65 | Temp 98.9°F | Resp 20 | Ht 71.0 in | Wt 252.2 lb

## 2019-02-13 DIAGNOSIS — C712 Malignant neoplasm of temporal lobe: Secondary | ICD-10-CM

## 2019-02-13 DIAGNOSIS — F329 Major depressive disorder, single episode, unspecified: Secondary | ICD-10-CM

## 2019-02-13 DIAGNOSIS — Z923 Personal history of irradiation: Secondary | ICD-10-CM | POA: Diagnosis not present

## 2019-02-13 DIAGNOSIS — M129 Arthropathy, unspecified: Secondary | ICD-10-CM

## 2019-02-13 DIAGNOSIS — Z9221 Personal history of antineoplastic chemotherapy: Secondary | ICD-10-CM

## 2019-02-13 DIAGNOSIS — R5383 Other fatigue: Secondary | ICD-10-CM | POA: Diagnosis not present

## 2019-02-13 DIAGNOSIS — Z79899 Other long term (current) drug therapy: Secondary | ICD-10-CM | POA: Diagnosis not present

## 2019-02-13 DIAGNOSIS — I10 Essential (primary) hypertension: Secondary | ICD-10-CM | POA: Diagnosis not present

## 2019-02-13 DIAGNOSIS — Z87442 Personal history of urinary calculi: Secondary | ICD-10-CM

## 2019-02-13 DIAGNOSIS — Z87891 Personal history of nicotine dependence: Secondary | ICD-10-CM | POA: Diagnosis not present

## 2019-02-13 LAB — CBC WITH DIFFERENTIAL (CANCER CENTER ONLY)
Abs Immature Granulocytes: 0.03 10*3/uL (ref 0.00–0.07)
BASOS ABS: 0.1 10*3/uL (ref 0.0–0.1)
Basophils Relative: 1 %
EOS ABS: 0.2 10*3/uL (ref 0.0–0.5)
Eosinophils Relative: 3 %
HEMATOCRIT: 47 % (ref 39.0–52.0)
Hemoglobin: 16.1 g/dL (ref 13.0–17.0)
IMMATURE GRANULOCYTES: 0 %
LYMPHS ABS: 1.2 10*3/uL (ref 0.7–4.0)
LYMPHS PCT: 17 %
MCH: 31.1 pg (ref 26.0–34.0)
MCHC: 34.3 g/dL (ref 30.0–36.0)
MCV: 90.9 fL (ref 80.0–100.0)
MONOS PCT: 9 %
Monocytes Absolute: 0.6 10*3/uL (ref 0.1–1.0)
Neutro Abs: 4.9 10*3/uL (ref 1.7–7.7)
Neutrophils Relative %: 70 %
Platelet Count: 548 10*3/uL — ABNORMAL HIGH (ref 150–400)
RBC: 5.17 MIL/uL (ref 4.22–5.81)
RDW: 13 % (ref 11.5–15.5)
WBC Count: 7.1 10*3/uL (ref 4.0–10.5)
nRBC: 0 % (ref 0.0–0.2)

## 2019-02-13 LAB — COMPREHENSIVE METABOLIC PANEL
ALBUMIN: 4.3 g/dL (ref 3.5–5.0)
ALT: 75 U/L — ABNORMAL HIGH (ref 0–44)
ANION GAP: 6 (ref 5–15)
AST: 52 U/L — AB (ref 15–41)
Alkaline Phosphatase: 58 U/L (ref 38–126)
BILIRUBIN TOTAL: 0.7 mg/dL (ref 0.3–1.2)
BUN: 12 mg/dL (ref 6–20)
CHLORIDE: 102 mmol/L (ref 98–111)
CO2: 29 mmol/L (ref 22–32)
Calcium: 9.8 mg/dL (ref 8.9–10.3)
Creatinine, Ser: 1.02 mg/dL (ref 0.61–1.24)
GFR calc Af Amer: >60 mL/min (ref >60)
GFR calc non Af Amer: >60 mL/min (ref >60)
GLUCOSE: 88 mg/dL (ref 70–99)
POTASSIUM: 4.2 mmol/L (ref 3.5–5.1)
SODIUM: 137 mmol/L (ref 135–145)
TOTAL PROTEIN: 7 g/dL (ref 6.5–8.1)
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
## 2019-02-13 NOTE — Telephone Encounter (Signed)
Gave avs and calendar ° °

## 2019-02-17 ENCOUNTER — Other Ambulatory Visit: Payer: Self-pay | Admitting: Internal Medicine

## 2019-02-25 ENCOUNTER — Telehealth: Payer: Self-pay | Admitting: *Deleted

## 2019-02-25 NOTE — Telephone Encounter (Signed)
Medical records faxed to [facility]; [place] [phone]

## 2019-03-05 ENCOUNTER — Telehealth: Payer: Self-pay | Admitting: *Deleted

## 2019-03-05 NOTE — Telephone Encounter (Signed)
[employer] requested updated Assessment form and copy of last MRI result.  Faxed to [phone].

## 2019-03-10 ENCOUNTER — Other Ambulatory Visit: Payer: Self-pay | Admitting: Internal Medicine

## 2019-03-20 ENCOUNTER — Encounter: Payer: Self-pay | Admitting: Internal Medicine

## 2019-03-20 ENCOUNTER — Other Ambulatory Visit: Payer: Self-pay

## 2019-03-20 ENCOUNTER — Inpatient Hospital Stay: Payer: PRIVATE HEALTH INSURANCE | Attending: Internal Medicine | Admitting: Internal Medicine

## 2019-03-20 ENCOUNTER — Telehealth: Payer: Self-pay | Admitting: Internal Medicine

## 2019-03-20 ENCOUNTER — Inpatient Hospital Stay: Payer: PRIVATE HEALTH INSURANCE

## 2019-03-20 VITALS — BP 151/77 | HR 79 | Temp 98.7°F | Resp 19 | Ht 71.0 in | Wt 256.2 lb

## 2019-03-20 DIAGNOSIS — I10 Essential (primary) hypertension: Secondary | ICD-10-CM | POA: Diagnosis not present

## 2019-03-20 DIAGNOSIS — Z79899 Other long term (current) drug therapy: Secondary | ICD-10-CM | POA: Diagnosis not present

## 2019-03-20 DIAGNOSIS — F329 Major depressive disorder, single episode, unspecified: Secondary | ICD-10-CM | POA: Insufficient documentation

## 2019-03-20 DIAGNOSIS — Z9221 Personal history of antineoplastic chemotherapy: Secondary | ICD-10-CM | POA: Insufficient documentation

## 2019-03-20 DIAGNOSIS — K760 Fatty (change of) liver, not elsewhere classified: Secondary | ICD-10-CM | POA: Insufficient documentation

## 2019-03-20 DIAGNOSIS — M129 Arthropathy, unspecified: Secondary | ICD-10-CM | POA: Diagnosis not present

## 2019-03-20 DIAGNOSIS — C712 Malignant neoplasm of temporal lobe: Secondary | ICD-10-CM

## 2019-03-20 DIAGNOSIS — Z87442 Personal history of urinary calculi: Secondary | ICD-10-CM | POA: Diagnosis not present

## 2019-03-20 DIAGNOSIS — Z87891 Personal history of nicotine dependence: Secondary | ICD-10-CM | POA: Diagnosis not present

## 2019-03-20 LAB — CBC WITH DIFFERENTIAL (CANCER CENTER ONLY)
Abs Immature Granulocytes: 0.02 10*3/uL (ref 0.00–0.07)
Basophils Absolute: 0.1 10*3/uL (ref 0.0–0.1)
Basophils Relative: 2 %
Eosinophils Absolute: 0.3 10*3/uL (ref 0.0–0.5)
Eosinophils Relative: 3 %
HCT: 47.4 % (ref 39.0–52.0)
Hemoglobin: 16.4 g/dL (ref 13.0–17.0)
Immature Granulocytes: 0 %
Lymphocytes Relative: 17 %
Lymphs Abs: 1.3 10*3/uL (ref 0.7–4.0)
MCH: 31.7 pg (ref 26.0–34.0)
MCHC: 34.6 g/dL (ref 30.0–36.0)
MCV: 91.7 fL (ref 80.0–100.0)
Monocytes Absolute: 0.7 10*3/uL (ref 0.1–1.0)
Monocytes Relative: 10 %
Neutro Abs: 5.2 10*3/uL (ref 1.7–7.7)
Neutrophils Relative %: 68 %
Platelet Count: 548 10*3/uL — ABNORMAL HIGH (ref 150–400)
RBC: 5.17 MIL/uL (ref 4.22–5.81)
RDW: 13 % (ref 11.5–15.5)
WBC Count: 7.6 10*3/uL (ref 4.0–10.5)
nRBC: 0 % (ref 0.0–0.2)

## 2019-03-20 LAB — CMP (CANCER CENTER ONLY)
ALT: 91 U/L — ABNORMAL HIGH (ref 0–44)
AST: 48 U/L — ABNORMAL HIGH (ref 15–41)
Albumin: 4.2 g/dL (ref 3.5–5.0)
Alkaline Phosphatase: 66 U/L (ref 38–126)
Anion gap: 11 (ref 5–15)
BUN: 12 mg/dL (ref 6–20)
CO2: 26 mmol/L (ref 22–32)
Calcium: 9.7 mg/dL (ref 8.9–10.3)
Chloride: 103 mmol/L (ref 98–111)
Creatinine: 1.04 mg/dL (ref 0.61–1.24)
GFR, Est AFR Am: >60 mL/min (ref >60)
GFR, Estimated: >60 mL/min (ref >60)
Glucose, Bld: 98 mg/dL (ref 70–99)
Potassium: 4 mmol/L (ref 3.5–5.1)
Sodium: 140 mmol/L (ref 135–145)
Total Bilirubin: 0.5 mg/dL (ref 0.3–1.2)
Total Protein: 7 g/dL (ref 6.5–8.1)
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
## 2019-03-20 NOTE — Telephone Encounter (Signed)
Called and scheduled appt per 4/10 los.  Patient aware of appt date.

## 2019-04-07 ENCOUNTER — Other Ambulatory Visit: Payer: Self-pay | Admitting: Internal Medicine

## 2019-04-13 ENCOUNTER — Other Ambulatory Visit: Payer: Self-pay | Admitting: Radiation Therapy

## 2019-04-14 ENCOUNTER — Other Ambulatory Visit: Payer: Self-pay | Admitting: Internal Medicine

## 2019-04-17 ENCOUNTER — Other Ambulatory Visit: Payer: Self-pay

## 2019-04-17 ENCOUNTER — Ambulatory Visit
Admission: RE | Admit: 2019-04-17 | Discharge: 2019-04-17 | Disposition: A | Discharge status: Home / Self Care | Payer: PRIVATE HEALTH INSURANCE | Source: Ambulatory Visit | Attending: Internal Medicine | Admitting: Internal Medicine

## 2019-04-17 DIAGNOSIS — C712 Malignant neoplasm of temporal lobe: Secondary | ICD-10-CM

## 2019-04-17 MED ORDER — GADOBENATE DIMEGLUMINE 529 MG/ML IV SOLN
20.0000 mL | Freq: Once | INTRAVENOUS | Status: AC | PRN
  Administered 2019-04-17: 20 mL via INTRAVENOUS

## 2019-04-20 ENCOUNTER — Telehealth: Payer: Self-pay | Admitting: Internal Medicine

## 2019-04-20 ENCOUNTER — Inpatient Hospital Stay: Payer: PRIVATE HEALTH INSURANCE | Attending: Internal Medicine

## 2019-04-20 ENCOUNTER — Telehealth: Payer: Self-pay | Admitting: Radiation Oncology

## 2019-04-20 ENCOUNTER — Other Ambulatory Visit: Payer: Self-pay

## 2019-04-20 ENCOUNTER — Inpatient Hospital Stay (HOSPITAL_BASED_OUTPATIENT_CLINIC_OR_DEPARTMENT_OTHER): Payer: PRIVATE HEALTH INSURANCE | Admitting: Internal Medicine

## 2019-04-20 VITALS — BP 141/88 | HR 75 | Temp 99.4°F | Resp 17 | Ht 71.0 in | Wt 253.8 lb

## 2019-04-20 DIAGNOSIS — C712 Malignant neoplasm of temporal lobe: Secondary | ICD-10-CM | POA: Diagnosis present

## 2019-04-20 DIAGNOSIS — Z79899 Other long term (current) drug therapy: Secondary | ICD-10-CM | POA: Diagnosis not present

## 2019-04-20 DIAGNOSIS — R5383 Other fatigue: Secondary | ICD-10-CM | POA: Insufficient documentation

## 2019-04-20 DIAGNOSIS — M129 Arthropathy, unspecified: Secondary | ICD-10-CM | POA: Diagnosis not present

## 2019-04-20 DIAGNOSIS — F329 Major depressive disorder, single episode, unspecified: Secondary | ICD-10-CM | POA: Diagnosis not present

## 2019-04-20 DIAGNOSIS — I10 Essential (primary) hypertension: Secondary | ICD-10-CM | POA: Insufficient documentation

## 2019-04-20 DIAGNOSIS — Z87891 Personal history of nicotine dependence: Secondary | ICD-10-CM | POA: Diagnosis not present

## 2019-04-20 DIAGNOSIS — L409 Psoriasis, unspecified: Secondary | ICD-10-CM | POA: Diagnosis not present

## 2019-04-20 DIAGNOSIS — Z87442 Personal history of urinary calculi: Secondary | ICD-10-CM

## 2019-04-20 LAB — CMP (CANCER CENTER ONLY)
ALT: 120 U/L — ABNORMAL HIGH (ref 0–44)
AST: 66 U/L — ABNORMAL HIGH (ref 15–41)
Albumin: 4.2 g/dL (ref 3.5–5.0)
Alkaline Phosphatase: 62 U/L (ref 38–126)
Anion gap: 9 (ref 5–15)
BUN: 9 mg/dL (ref 6–20)
CO2: 27 mmol/L (ref 22–32)
Calcium: 9.6 mg/dL (ref 8.9–10.3)
Chloride: 105 mmol/L (ref 98–111)
Creatinine: 1.08 mg/dL (ref 0.61–1.24)
GFR, Est AFR Am: >60 mL/min (ref >60)
GFR, Estimated: >60 mL/min (ref >60)
Glucose, Bld: 98 mg/dL (ref 70–99)
Potassium: 3.9 mmol/L (ref 3.5–5.1)
Sodium: 141 mmol/L (ref 135–145)
Total Bilirubin: 0.6 mg/dL (ref 0.3–1.2)
Total Protein: 7 g/dL (ref 6.5–8.1)

## 2019-04-20 LAB — CBC WITH DIFFERENTIAL (CANCER CENTER ONLY)
Abs Immature Granulocytes: 0.03 10*3/uL (ref 0.00–0.07)
Basophils Absolute: 0.1 10*3/uL (ref 0.0–0.1)
Basophils Relative: 1 %
Eosinophils Absolute: 0.2 10*3/uL (ref 0.0–0.5)
Eosinophils Relative: 3 %
HCT: 46.5 % (ref 39.0–52.0)
Hemoglobin: 15.9 g/dL (ref 13.0–17.0)
Immature Granulocytes: 1 %
Lymphocytes Relative: 16 %
Lymphs Abs: 0.9 10*3/uL (ref 0.7–4.0)
MCH: 31.5 pg (ref 26.0–34.0)
MCHC: 34.2 g/dL (ref 30.0–36.0)
MCV: 92.1 fL (ref 80.0–100.0)
Monocytes Absolute: 0.5 10*3/uL (ref 0.1–1.0)
Monocytes Relative: 9 %
Neutro Abs: 4 10*3/uL (ref 1.7–7.7)
Neutrophils Relative %: 70 %
Platelet Count: 472 10*3/uL — ABNORMAL HIGH (ref 150–400)
RBC: 5.05 MIL/uL (ref 4.22–5.81)
RDW: 13.2 % (ref 11.5–15.5)
WBC Count: 5.6 10*3/uL (ref 4.0–10.5)
nRBC: 0 % (ref 0.0–0.2)
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
## 2019-04-20 NOTE — Telephone Encounter (Signed)
Scheduled appt per 5/11 los. ° °A calendar will be mailed out. °

## 2019-04-21 NOTE — Telephone Encounter (Signed)
error 

## 2019-04-27 ENCOUNTER — Telehealth: Payer: Self-pay | Admitting: *Deleted

## 2019-04-27 NOTE — Telephone Encounter (Signed)
Received request from [facility] needing updated MRI results and updated treatment plan.  Faxed to [phone]

## 2019-05-07 ENCOUNTER — Other Ambulatory Visit: Payer: Self-pay | Admitting: Internal Medicine

## 2019-05-18 ENCOUNTER — Inpatient Hospital Stay (HOSPITAL_BASED_OUTPATIENT_CLINIC_OR_DEPARTMENT_OTHER): Payer: PRIVATE HEALTH INSURANCE | Admitting: Internal Medicine

## 2019-05-18 ENCOUNTER — Other Ambulatory Visit: Payer: Self-pay

## 2019-05-18 ENCOUNTER — Telehealth: Payer: Self-pay | Admitting: Internal Medicine

## 2019-05-18 ENCOUNTER — Encounter: Payer: Self-pay | Admitting: Internal Medicine

## 2019-05-18 ENCOUNTER — Inpatient Hospital Stay: Payer: PRIVATE HEALTH INSURANCE | Attending: Internal Medicine

## 2019-05-18 VITALS — BP 135/88 | HR 75 | Temp 99.1°F | Resp 17 | Ht 71.0 in | Wt 255.0 lb

## 2019-05-18 DIAGNOSIS — R112 Nausea with vomiting, unspecified: Secondary | ICD-10-CM | POA: Diagnosis not present

## 2019-05-18 DIAGNOSIS — Z87891 Personal history of nicotine dependence: Secondary | ICD-10-CM

## 2019-05-18 DIAGNOSIS — L409 Psoriasis, unspecified: Secondary | ICD-10-CM | POA: Insufficient documentation

## 2019-05-18 DIAGNOSIS — Z79899 Other long term (current) drug therapy: Secondary | ICD-10-CM

## 2019-05-18 DIAGNOSIS — C712 Malignant neoplasm of temporal lobe: Secondary | ICD-10-CM | POA: Diagnosis not present

## 2019-05-18 DIAGNOSIS — M129 Arthropathy, unspecified: Secondary | ICD-10-CM | POA: Insufficient documentation

## 2019-05-18 DIAGNOSIS — Z87442 Personal history of urinary calculi: Secondary | ICD-10-CM

## 2019-05-18 DIAGNOSIS — I10 Essential (primary) hypertension: Secondary | ICD-10-CM | POA: Diagnosis not present

## 2019-05-18 DIAGNOSIS — F329 Major depressive disorder, single episode, unspecified: Secondary | ICD-10-CM | POA: Diagnosis not present

## 2019-05-18 DIAGNOSIS — R5383 Other fatigue: Secondary | ICD-10-CM | POA: Diagnosis not present

## 2019-05-18 LAB — CBC WITH DIFFERENTIAL (CANCER CENTER ONLY)
Abs Immature Granulocytes: 0.03 10*3/uL (ref 0.00–0.07)
Basophils Absolute: 0.1 10*3/uL (ref 0.0–0.1)
Basophils Relative: 2 %
Eosinophils Absolute: 0.3 10*3/uL (ref 0.0–0.5)
Eosinophils Relative: 5 %
HCT: 48.4 % (ref 39.0–52.0)
Hemoglobin: 16.5 g/dL (ref 13.0–17.0)
Immature Granulocytes: 0 %
Lymphocytes Relative: 18 %
Lymphs Abs: 1.2 10*3/uL (ref 0.7–4.0)
MCH: 31.5 pg (ref 26.0–34.0)
MCHC: 34.1 g/dL (ref 30.0–36.0)
MCV: 92.4 fL (ref 80.0–100.0)
Monocytes Absolute: 0.5 10*3/uL (ref 0.1–1.0)
Monocytes Relative: 7 %
Neutro Abs: 4.6 10*3/uL (ref 1.7–7.7)
Neutrophils Relative %: 68 %
Platelet Count: 566 10*3/uL — ABNORMAL HIGH (ref 150–400)
RBC: 5.24 MIL/uL (ref 4.22–5.81)
RDW: 13.2 % (ref 11.5–15.5)
WBC Count: 6.7 10*3/uL (ref 4.0–10.5)
nRBC: 0 % (ref 0.0–0.2)

## 2019-05-18 LAB — CMP (CANCER CENTER ONLY)
ALT: 111 U/L — ABNORMAL HIGH (ref 0–44)
AST: 61 U/L — ABNORMAL HIGH (ref 15–41)
Albumin: 4.3 g/dL (ref 3.5–5.0)
Alkaline Phosphatase: 68 U/L (ref 38–126)
Anion gap: 10 (ref 5–15)
BUN: 9 mg/dL (ref 6–20)
CO2: 27 mmol/L (ref 22–32)
Calcium: 9.4 mg/dL (ref 8.9–10.3)
Chloride: 104 mmol/L (ref 98–111)
Creatinine: 1.08 mg/dL (ref 0.61–1.24)
GFR, Est AFR Am: >60 mL/min (ref >60)
GFR, Estimated: >60 mL/min (ref >60)
Glucose, Bld: 111 mg/dL — ABNORMAL HIGH (ref 70–99)
Potassium: 4 mmol/L (ref 3.5–5.1)
Sodium: 141 mmol/L (ref 135–145)
Total Bilirubin: 0.6 mg/dL (ref 0.3–1.2)
Total Protein: 6.9 g/dL (ref 6.5–8.1)

## 2019-05-18 MED ORDER — LEVETIRACETAM 250 MG PO TABS: 250.0000 mg | ORAL_TABLET | Freq: Two times a day (BID) | ORAL | 3 refills | Status: DC

## 2019-05-18 NOTE — Telephone Encounter (Signed)
Scheduled appt per 6/8 los. Left a voice message of appt date and time. °
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
## 2019-05-29 ENCOUNTER — Other Ambulatory Visit: Payer: Self-pay | Admitting: Internal Medicine

## 2019-06-01 ENCOUNTER — Other Ambulatory Visit: Payer: Self-pay | Admitting: Radiation Therapy

## 2019-06-01 ENCOUNTER — Other Ambulatory Visit: Payer: Self-pay | Admitting: Internal Medicine

## 2019-06-04 ENCOUNTER — Ambulatory Visit
Admission: RE | Admit: 2019-06-04 | Discharge: 2019-06-04 | Disposition: A | Discharge status: Home / Self Care | Payer: PRIVATE HEALTH INSURANCE | Source: Ambulatory Visit | Attending: Internal Medicine | Admitting: Internal Medicine

## 2019-06-04 DIAGNOSIS — C712 Malignant neoplasm of temporal lobe: Secondary | ICD-10-CM

## 2019-06-04 MED ORDER — GADOBENATE DIMEGLUMINE 529 MG/ML IV SOLN
20.0000 mL | Freq: Once | INTRAVENOUS | Status: AC | PRN
  Administered 2019-06-04: 20 mL via INTRAVENOUS

## 2019-06-08 ENCOUNTER — Inpatient Hospital Stay: Payer: PRIVATE HEALTH INSURANCE

## 2019-06-15 ENCOUNTER — Inpatient Hospital Stay (HOSPITAL_BASED_OUTPATIENT_CLINIC_OR_DEPARTMENT_OTHER): Payer: PRIVATE HEALTH INSURANCE | Admitting: Internal Medicine

## 2019-06-15 ENCOUNTER — Telehealth: Payer: Self-pay | Admitting: Internal Medicine

## 2019-06-15 ENCOUNTER — Other Ambulatory Visit: Payer: Self-pay

## 2019-06-15 ENCOUNTER — Inpatient Hospital Stay: Payer: PRIVATE HEALTH INSURANCE | Attending: Internal Medicine

## 2019-06-15 VITALS — BP 143/95 | HR 94 | Temp 98.2°F | Resp 18 | Ht 71.0 in | Wt 252.1 lb

## 2019-06-15 DIAGNOSIS — F329 Major depressive disorder, single episode, unspecified: Secondary | ICD-10-CM

## 2019-06-15 DIAGNOSIS — F17210 Nicotine dependence, cigarettes, uncomplicated: Secondary | ICD-10-CM | POA: Diagnosis not present

## 2019-06-15 DIAGNOSIS — C712 Malignant neoplasm of temporal lobe: Secondary | ICD-10-CM | POA: Insufficient documentation

## 2019-06-15 DIAGNOSIS — K760 Fatty (change of) liver, not elsewhere classified: Secondary | ICD-10-CM

## 2019-06-15 DIAGNOSIS — Z79899 Other long term (current) drug therapy: Secondary | ICD-10-CM | POA: Diagnosis not present

## 2019-06-15 DIAGNOSIS — M129 Arthropathy, unspecified: Secondary | ICD-10-CM

## 2019-06-15 DIAGNOSIS — I10 Essential (primary) hypertension: Secondary | ICD-10-CM | POA: Insufficient documentation

## 2019-06-15 LAB — CBC WITH DIFFERENTIAL (CANCER CENTER ONLY)
Abs Immature Granulocytes: 0.01 10*3/uL (ref 0.00–0.07)
Basophils Absolute: 0.1 10*3/uL (ref 0.0–0.1)
Basophils Relative: 1 %
Eosinophils Absolute: 0.2 10*3/uL (ref 0.0–0.5)
Eosinophils Relative: 3 %
HCT: 47.3 % (ref 39.0–52.0)
Hemoglobin: 16.5 g/dL (ref 13.0–17.0)
Immature Granulocytes: 0 %
Lymphocytes Relative: 15 %
Lymphs Abs: 1 10*3/uL (ref 0.7–4.0)
MCH: 31.7 pg (ref 26.0–34.0)
MCHC: 34.9 g/dL (ref 30.0–36.0)
MCV: 90.8 fL (ref 80.0–100.0)
Monocytes Absolute: 0.4 10*3/uL (ref 0.1–1.0)
Monocytes Relative: 6 %
Neutro Abs: 4.6 10*3/uL (ref 1.7–7.7)
Neutrophils Relative %: 75 %
Platelet Count: 536 10*3/uL — ABNORMAL HIGH (ref 150–400)
RBC: 5.21 MIL/uL (ref 4.22–5.81)
RDW: 13.1 % (ref 11.5–15.5)
WBC Count: 6.2 10*3/uL (ref 4.0–10.5)
nRBC: 0 % (ref 0.0–0.2)

## 2019-06-15 LAB — CMP (CANCER CENTER ONLY)
ALT: 111 U/L — ABNORMAL HIGH (ref 0–44)
AST: 67 U/L — ABNORMAL HIGH (ref 15–41)
Albumin: 4.2 g/dL (ref 3.5–5.0)
Alkaline Phosphatase: 64 U/L (ref 38–126)
Anion gap: 10 (ref 5–15)
BUN: 9 mg/dL (ref 6–20)
CO2: 26 mmol/L (ref 22–32)
Calcium: 9.3 mg/dL (ref 8.9–10.3)
Chloride: 105 mmol/L (ref 98–111)
Creatinine: 1.06 mg/dL (ref 0.61–1.24)
GFR, Est AFR Am: >60 mL/min (ref >60)
GFR, Estimated: >60 mL/min (ref >60)
Glucose, Bld: 124 mg/dL — ABNORMAL HIGH (ref 70–99)
Potassium: 3.8 mmol/L (ref 3.5–5.1)
Sodium: 141 mmol/L (ref 135–145)
Total Bilirubin: 0.7 mg/dL (ref 0.3–1.2)
Total Protein: 7 g/dL (ref 6.5–8.1)
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
## 2019-06-15 NOTE — Telephone Encounter (Signed)
Gave avs and calendar ° °

## 2019-06-22 ENCOUNTER — Other Ambulatory Visit: Payer: PRIVATE HEALTH INSURANCE

## 2019-06-22 ENCOUNTER — Telehealth: Payer: Self-pay | Admitting: *Deleted

## 2019-06-22 NOTE — Telephone Encounter (Signed)
Received request from [facility] case manager Kandra Nicolas asking for current MRI results from 06/04/2019 and updated plan of treatment for patient.  Faxed documents to [facility] [phone]

## 2019-07-15 ENCOUNTER — Other Ambulatory Visit: Payer: Self-pay | Admitting: Radiation Therapy

## 2019-07-31 ENCOUNTER — Telehealth: Payer: Self-pay | Admitting: Internal Medicine

## 2019-07-31 NOTE — Telephone Encounter (Signed)
ZV PAL 9/11 f/u moved to 9/10. Confirmed with patient.

## 2019-08-14 ENCOUNTER — Other Ambulatory Visit: Payer: Self-pay

## 2019-08-14 ENCOUNTER — Ambulatory Visit
Admission: RE | Admit: 2019-08-14 | Discharge: 2019-08-14 | Disposition: A | Discharge status: Home / Self Care | Payer: PRIVATE HEALTH INSURANCE | Source: Ambulatory Visit | Attending: Internal Medicine | Admitting: Internal Medicine

## 2019-08-14 DIAGNOSIS — C712 Malignant neoplasm of temporal lobe: Secondary | ICD-10-CM

## 2019-08-14 MED ORDER — GADOBENATE DIMEGLUMINE 529 MG/ML IV SOLN
20.0000 mL | Freq: Once | INTRAVENOUS | Status: AC | PRN
  Administered 2019-08-14: 20 mL via INTRAVENOUS

## 2019-08-19 ENCOUNTER — Telehealth: Payer: Self-pay | Admitting: Internal Medicine

## 2019-08-19 NOTE — Telephone Encounter (Signed)
Called patient regarding prescreening questions, patient has been asked and notified.

## 2019-08-20 ENCOUNTER — Telehealth: Payer: Self-pay | Admitting: Internal Medicine

## 2019-08-20 ENCOUNTER — Other Ambulatory Visit: Payer: Self-pay

## 2019-08-20 ENCOUNTER — Inpatient Hospital Stay: Payer: PRIVATE HEALTH INSURANCE | Attending: Internal Medicine | Admitting: Internal Medicine

## 2019-08-20 VITALS — BP 145/86 | HR 86 | Temp 98.5°F | Resp 16

## 2019-08-20 DIAGNOSIS — K760 Fatty (change of) liver, not elsewhere classified: Secondary | ICD-10-CM | POA: Insufficient documentation

## 2019-08-20 DIAGNOSIS — F418 Other specified anxiety disorders: Secondary | ICD-10-CM | POA: Insufficient documentation

## 2019-08-20 DIAGNOSIS — Z87442 Personal history of urinary calculi: Secondary | ICD-10-CM | POA: Insufficient documentation

## 2019-08-20 DIAGNOSIS — Z79899 Other long term (current) drug therapy: Secondary | ICD-10-CM | POA: Insufficient documentation

## 2019-08-20 DIAGNOSIS — M129 Arthropathy, unspecified: Secondary | ICD-10-CM | POA: Diagnosis not present

## 2019-08-20 DIAGNOSIS — Z87891 Personal history of nicotine dependence: Secondary | ICD-10-CM | POA: Insufficient documentation

## 2019-08-20 DIAGNOSIS — R5383 Other fatigue: Secondary | ICD-10-CM | POA: Diagnosis not present

## 2019-08-20 DIAGNOSIS — R51 Headache: Secondary | ICD-10-CM | POA: Diagnosis not present

## 2019-08-20 DIAGNOSIS — I10 Essential (primary) hypertension: Secondary | ICD-10-CM | POA: Diagnosis not present

## 2019-08-20 DIAGNOSIS — C712 Malignant neoplasm of temporal lobe: Secondary | ICD-10-CM | POA: Insufficient documentation
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
## 2019-08-20 NOTE — Telephone Encounter (Signed)
Scheduled appt per 9/10 los.  Spoke with patient and he is aware of his scheduled appt date and time

## 2019-08-21 ENCOUNTER — Ambulatory Visit: Payer: PRIVATE HEALTH INSURANCE | Admitting: Internal Medicine

## 2019-08-24 ENCOUNTER — Inpatient Hospital Stay: Payer: PRIVATE HEALTH INSURANCE

## 2019-09-14 ENCOUNTER — Other Ambulatory Visit: Payer: Self-pay | Admitting: Radiation Therapy

## 2019-10-07 ENCOUNTER — Other Ambulatory Visit: Payer: Self-pay | Admitting: Internal Medicine

## 2019-10-09 ENCOUNTER — Other Ambulatory Visit: Payer: PRIVATE HEALTH INSURANCE

## 2019-10-12 ENCOUNTER — Inpatient Hospital Stay: Payer: PRIVATE HEALTH INSURANCE | Attending: Internal Medicine

## 2019-10-12 DIAGNOSIS — Z87442 Personal history of urinary calculi: Secondary | ICD-10-CM | POA: Insufficient documentation

## 2019-10-12 DIAGNOSIS — Z79899 Other long term (current) drug therapy: Secondary | ICD-10-CM | POA: Insufficient documentation

## 2019-10-12 DIAGNOSIS — F329 Major depressive disorder, single episode, unspecified: Secondary | ICD-10-CM | POA: Insufficient documentation

## 2019-10-12 DIAGNOSIS — Z87891 Personal history of nicotine dependence: Secondary | ICD-10-CM | POA: Insufficient documentation

## 2019-10-12 DIAGNOSIS — R5383 Other fatigue: Secondary | ICD-10-CM | POA: Insufficient documentation

## 2019-10-12 DIAGNOSIS — C712 Malignant neoplasm of temporal lobe: Secondary | ICD-10-CM | POA: Insufficient documentation

## 2019-10-12 DIAGNOSIS — Z9221 Personal history of antineoplastic chemotherapy: Secondary | ICD-10-CM | POA: Insufficient documentation

## 2019-10-12 DIAGNOSIS — H538 Other visual disturbances: Secondary | ICD-10-CM | POA: Insufficient documentation

## 2019-10-12 DIAGNOSIS — I10 Essential (primary) hypertension: Secondary | ICD-10-CM | POA: Insufficient documentation

## 2019-10-12 DIAGNOSIS — F418 Other specified anxiety disorders: Secondary | ICD-10-CM | POA: Insufficient documentation

## 2019-10-12 DIAGNOSIS — R251 Tremor, unspecified: Secondary | ICD-10-CM | POA: Insufficient documentation

## 2019-10-13 ENCOUNTER — Ambulatory Visit: Payer: PRIVATE HEALTH INSURANCE | Admitting: Internal Medicine

## 2019-10-15 ENCOUNTER — Other Ambulatory Visit: Payer: Self-pay | Admitting: Radiation Therapy

## 2019-10-16 ENCOUNTER — Ambulatory Visit: Payer: PRIVATE HEALTH INSURANCE | Admitting: Internal Medicine

## 2019-10-21 ENCOUNTER — Telehealth: Payer: Self-pay | Admitting: *Deleted

## 2019-10-21 NOTE — Telephone Encounter (Signed)
Pt called and left message requesting a call back from nurse.  Spoke with pt and was informed that his wife works at a rest home, and was tested positive for COVID.  Pt wanted to know what he should do.   Asked pt if has any symptoms of COVID, pt stated " NO ".  Instructed pt to go to [facility] for [place] testing today. Pt voiced understanding. Pt's   Phone    [phone].

## 2019-10-30 ENCOUNTER — Other Ambulatory Visit: Payer: Self-pay

## 2019-10-30 ENCOUNTER — Ambulatory Visit
Admission: RE | Admit: 2019-10-30 | Discharge: 2019-10-30 | Disposition: A | Discharge status: Home / Self Care | Payer: PRIVATE HEALTH INSURANCE | Source: Ambulatory Visit | Attending: Internal Medicine | Admitting: Internal Medicine

## 2019-10-30 DIAGNOSIS — C712 Malignant neoplasm of temporal lobe: Secondary | ICD-10-CM

## 2019-10-30 MED ORDER — GADOBENATE DIMEGLUMINE 529 MG/ML IV SOLN
20.0000 mL | Freq: Once | INTRAVENOUS | Status: AC | PRN
  Administered 2019-10-30: 08:00:00 20 mL via INTRAVENOUS

## 2019-11-02 ENCOUNTER — Other Ambulatory Visit: Payer: Self-pay

## 2019-11-02 ENCOUNTER — Inpatient Hospital Stay (HOSPITAL_BASED_OUTPATIENT_CLINIC_OR_DEPARTMENT_OTHER): Payer: PRIVATE HEALTH INSURANCE | Admitting: Internal Medicine

## 2019-11-02 VITALS — BP 137/93 | HR 89 | Temp 98.5°F | Resp 18 | Ht 71.0 in | Wt 259.3 lb

## 2019-11-02 DIAGNOSIS — C712 Malignant neoplasm of temporal lobe: Secondary | ICD-10-CM | POA: Diagnosis present

## 2019-11-02 DIAGNOSIS — F418 Other specified anxiety disorders: Secondary | ICD-10-CM | POA: Diagnosis not present

## 2019-11-02 DIAGNOSIS — Z9221 Personal history of antineoplastic chemotherapy: Secondary | ICD-10-CM | POA: Diagnosis not present

## 2019-11-02 DIAGNOSIS — Z79899 Other long term (current) drug therapy: Secondary | ICD-10-CM | POA: Diagnosis not present

## 2019-11-02 DIAGNOSIS — H538 Other visual disturbances: Secondary | ICD-10-CM | POA: Diagnosis not present

## 2019-11-02 DIAGNOSIS — Z87442 Personal history of urinary calculi: Secondary | ICD-10-CM | POA: Diagnosis not present

## 2019-11-02 DIAGNOSIS — Z87891 Personal history of nicotine dependence: Secondary | ICD-10-CM | POA: Diagnosis not present

## 2019-11-02 DIAGNOSIS — R5383 Other fatigue: Secondary | ICD-10-CM | POA: Diagnosis not present

## 2019-11-02 DIAGNOSIS — R251 Tremor, unspecified: Secondary | ICD-10-CM | POA: Diagnosis not present

## 2019-11-02 DIAGNOSIS — F329 Major depressive disorder, single episode, unspecified: Secondary | ICD-10-CM | POA: Diagnosis not present

## 2019-11-02 DIAGNOSIS — I10 Essential (primary) hypertension: Secondary | ICD-10-CM | POA: Diagnosis not present
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
## 2019-11-03 ENCOUNTER — Telehealth: Payer: Self-pay | Admitting: Internal Medicine

## 2019-11-03 NOTE — Telephone Encounter (Signed)
Scheduled appt per 11/23 los.  Left a VM of the appt date and time.

## 2019-11-04 ENCOUNTER — Inpatient Hospital Stay: Payer: PRIVATE HEALTH INSURANCE

## 2019-11-06 ENCOUNTER — Other Ambulatory Visit: Payer: Self-pay | Admitting: Internal Medicine

## 2019-12-18 ENCOUNTER — Other Ambulatory Visit: Payer: Self-pay | Admitting: Internal Medicine

## 2020-01-06 ENCOUNTER — Other Ambulatory Visit: Payer: Self-pay | Admitting: Radiation Therapy

## 2020-02-03 ENCOUNTER — Other Ambulatory Visit: Payer: Self-pay | Admitting: Internal Medicine

## 2020-02-04 NOTE — Telephone Encounter (Signed)
Rx Request 

## 2020-02-05 ENCOUNTER — Ambulatory Visit
Admission: RE | Admit: 2020-02-05 | Discharge: 2020-02-05 | Disposition: A | Discharge status: Home / Self Care | Payer: PRIVATE HEALTH INSURANCE | Source: Ambulatory Visit | Attending: Internal Medicine | Admitting: Internal Medicine

## 2020-02-05 DIAGNOSIS — C712 Malignant neoplasm of temporal lobe: Secondary | ICD-10-CM

## 2020-02-05 MED ORDER — GADOBENATE DIMEGLUMINE 529 MG/ML IV SOLN
20.0000 mL | Freq: Once | INTRAVENOUS | Status: AC | PRN
  Administered 2020-02-05: 20 mL via INTRAVENOUS

## 2020-02-08 ENCOUNTER — Inpatient Hospital Stay: Payer: PRIVATE HEALTH INSURANCE

## 2020-02-08 ENCOUNTER — Other Ambulatory Visit: Payer: Self-pay

## 2020-02-08 ENCOUNTER — Inpatient Hospital Stay: Payer: PRIVATE HEALTH INSURANCE | Attending: Internal Medicine | Admitting: Internal Medicine

## 2020-02-08 VITALS — BP 147/90 | HR 78 | Temp 98.5°F | Resp 18 | Ht 71.0 in | Wt 254.4 lb

## 2020-02-08 DIAGNOSIS — Z87891 Personal history of nicotine dependence: Secondary | ICD-10-CM | POA: Diagnosis not present

## 2020-02-08 DIAGNOSIS — R5383 Other fatigue: Secondary | ICD-10-CM | POA: Insufficient documentation

## 2020-02-08 DIAGNOSIS — Z923 Personal history of irradiation: Secondary | ICD-10-CM | POA: Insufficient documentation

## 2020-02-08 DIAGNOSIS — C712 Malignant neoplasm of temporal lobe: Secondary | ICD-10-CM | POA: Insufficient documentation

## 2020-02-08 DIAGNOSIS — K760 Fatty (change of) liver, not elsewhere classified: Secondary | ICD-10-CM | POA: Diagnosis not present

## 2020-02-08 DIAGNOSIS — I10 Essential (primary) hypertension: Secondary | ICD-10-CM | POA: Diagnosis not present

## 2020-02-08 DIAGNOSIS — Z87442 Personal history of urinary calculi: Secondary | ICD-10-CM | POA: Diagnosis not present

## 2020-02-08 DIAGNOSIS — Z9221 Personal history of antineoplastic chemotherapy: Secondary | ICD-10-CM | POA: Diagnosis not present

## 2020-02-08 DIAGNOSIS — Z79899 Other long term (current) drug therapy: Secondary | ICD-10-CM | POA: Diagnosis not present

## 2020-02-08 DIAGNOSIS — F418 Other specified anxiety disorders: Secondary | ICD-10-CM | POA: Diagnosis not present

## 2020-02-08 DIAGNOSIS — R519 Headache, unspecified: Secondary | ICD-10-CM | POA: Insufficient documentation
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
## 2020-02-09 ENCOUNTER — Telehealth: Payer: Self-pay | Admitting: Internal Medicine

## 2020-02-09 NOTE — Telephone Encounter (Signed)
Scheduled appt per 3/1 los. ° °Sent a message to HIM pool to get a calendar mailed out. °

## 2020-02-15 IMAGING — MR MRI HEAD WITHOUT AND WITH CONTRAST
13 series · 48 of 48 positions shown · IV contrast (20 ml multihance)
Comparison: MRI head 04/17/2019, 02/06/2019

CLINICAL DATA: Glioblastoma follow-up

EXAM:
MRI HEAD WITHOUT AND WITH CONTRAST
TECHNIQUE: Multiplanar, multiecho pulse sequences of the brain and surrounding
structures were obtained without and with intravenous contrast.
CONTRAST:  20mL MULTIHANCE GADOBENATE DIMEGLUMINE 529 MG/ML IV SOLN

[Series 5: T1 · sagittal · 4.0mm · 0.75mm/px · 2 of 31 slices shown (1 of 3)]
[im 1/31]
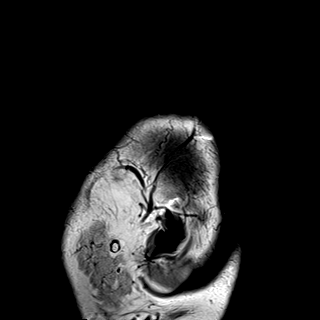
[im 31/31]
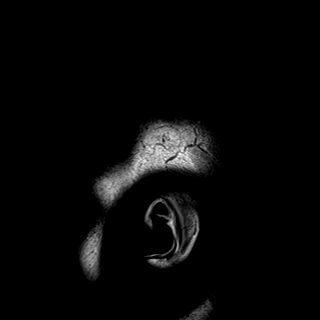

[Series 6: DWI · axial · 3.0mm · 1.44mm/px · z∈[-46,+89]mm · 5 of 84 slices shown (1 of 4)]
[im 1/84]
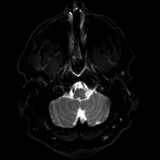
[im 21/84]
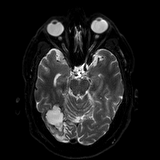
[im 42/84]
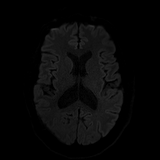
[im 63/84]
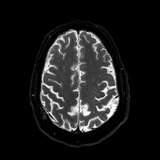
[im 84/84]
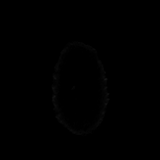

[Series 7: DWI · axial · 3.0mm · 1.44mm/px · z∈[-46,+89]mm · 2 of 42 slices shown (2 of 4)]
[im 1/42]
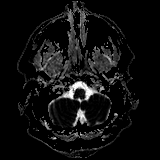
[im 42/42]
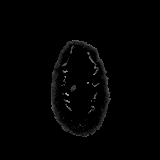

[Series 8: DWI · coronal · 5.0mm · 1.44mm/px · 3 of 60 slices shown (3 of 4)]
[im 1/60]
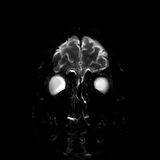
[im 30/60]
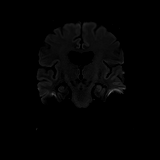
[im 60/60]
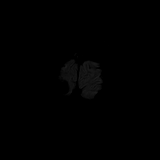

[Series 9: DWI · coronal · 5.0mm · 1.44mm/px · 2 of 30 slices shown (4 of 4)]
[im 1/30]
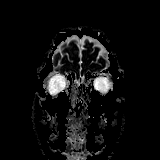
[im 30/30]
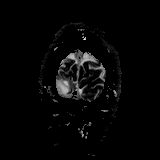

[Series 10: T2 · axial · 4.0mm · 0.36mm/px · z∈[-58,+98]mm · 2 of 31 slices shown]
[im 1/31]
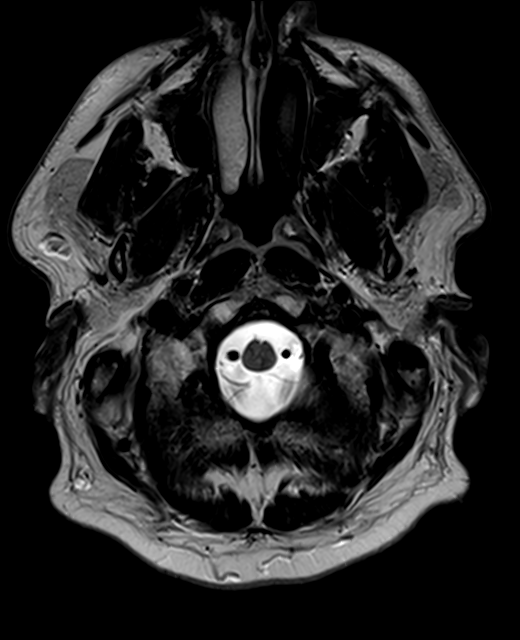
[im 31/31]
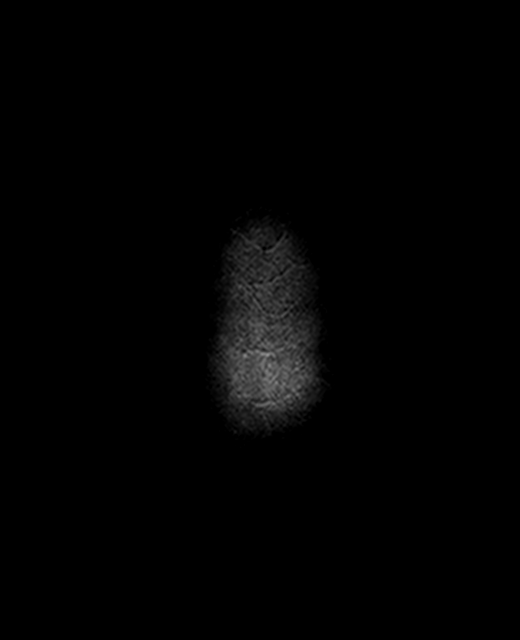

[Series 11: FLAIR · axial · 3.0mm · 0.72mm/px · z∈[-61,+101]mm · 2 of 28 slices shown]
[im 1/28]
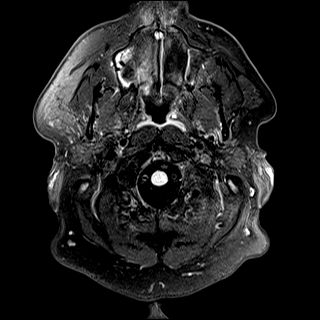
[im 28/28]
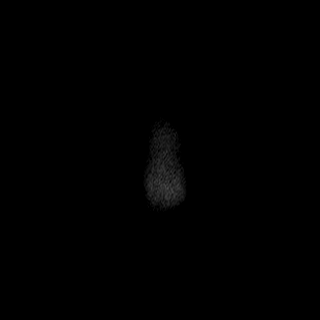

[Series 13: swi_images · axial · 1.5mm · 0.90mm/px · z∈[-57,+97]mm · 6 of 104 slices shown]
[im 1/104]
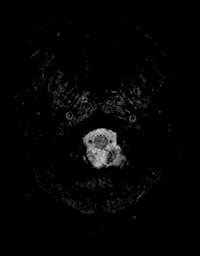
[im 21/104]
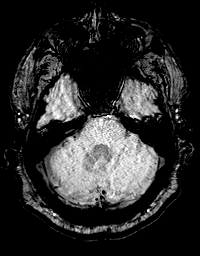
[im 42/104]
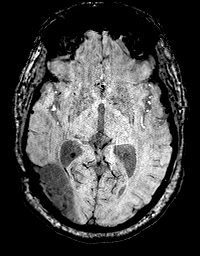
[im 62/104]
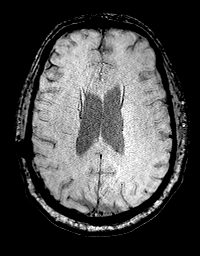
[im 83/104]
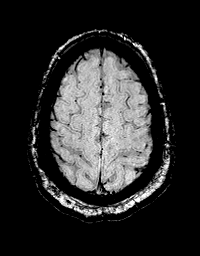
[im 104/104]
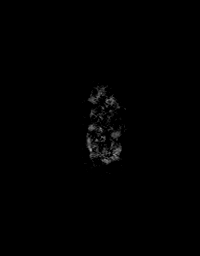

[Series 14: T1 · axial · 1.0mm · 0.90mm/px · z∈[-59,+99]mm · 9 of 160 slices shown (2 of 3)]
[im 1/160]
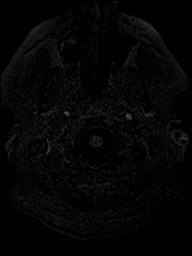
[im 20/160]
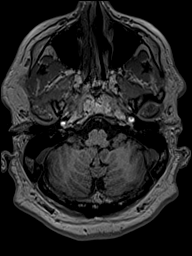
[im 40/160]
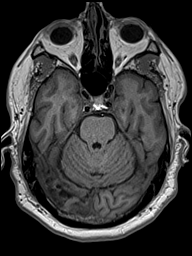
[im 60/160]
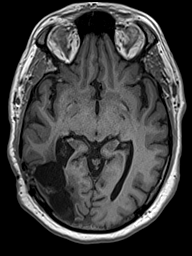
[im 80/160]
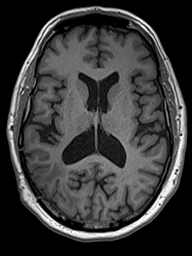
[im 100/160]
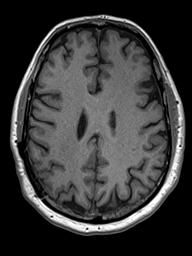
[im 120/160]
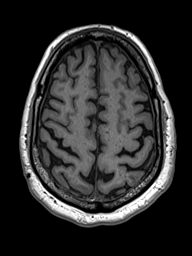
[im 140/160]
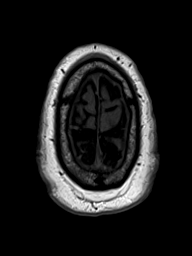
[im 160/160]
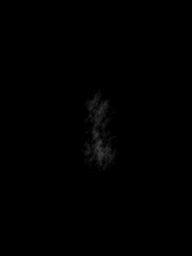

[Series 15: T2 post-contrast · coronal · 4.5mm · 0.36mm/px · 2 of 35 slices shown]
[im 1/35]
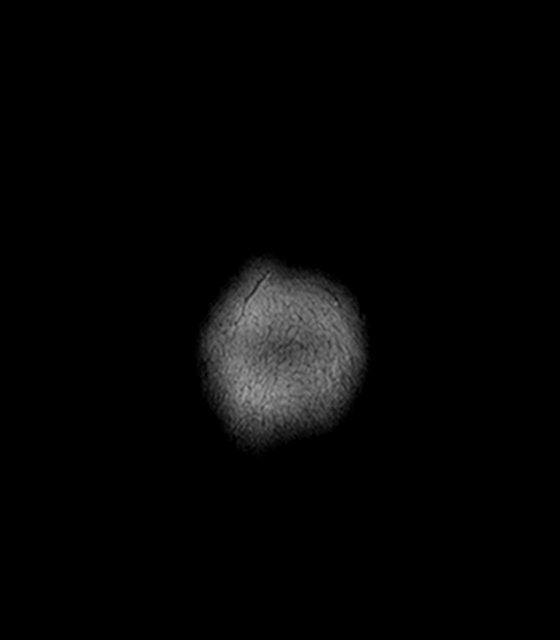
[im 35/35]
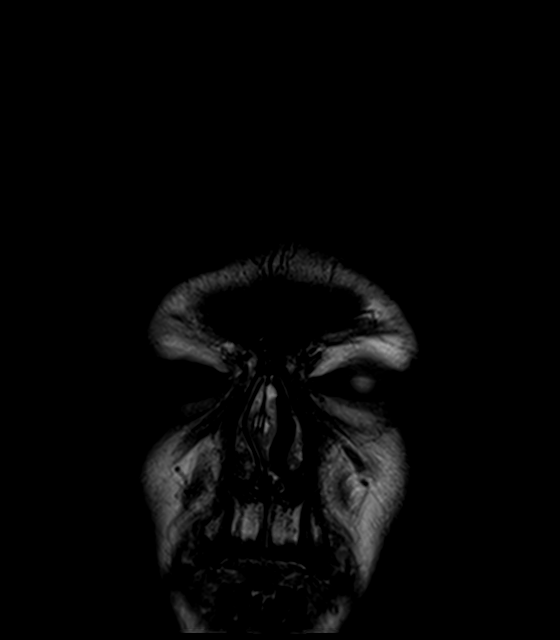

[Series 16: T1 · axial · 1.0mm · 0.90mm/px · z∈[-59,+99]mm · 9 of 160 slices shown (3 of 3)]
[im 1/160]
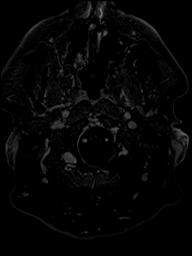
[im 20/160]
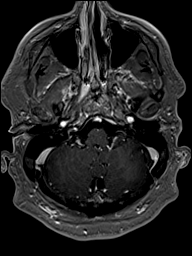
[im 40/160]
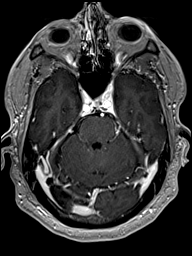
[im 60/160]
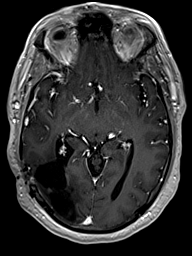
[im 80/160]
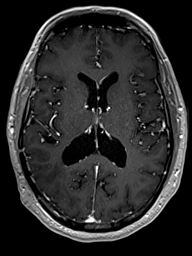
[im 100/160]
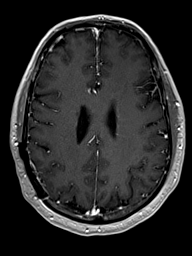
[im 120/160]
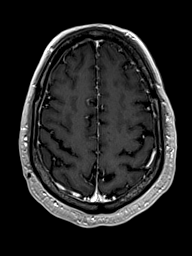
[im 140/160]
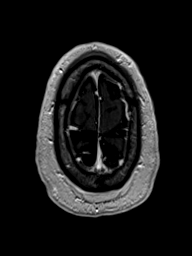
[im 160/160]
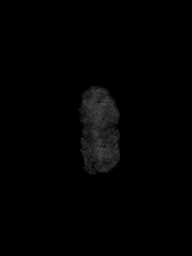

[Series 17: T1 post-contrast · coronal · 4.5mm · 0.72mm/px · 2 of 35 slices shown (1 of 2)]
[im 1/35]
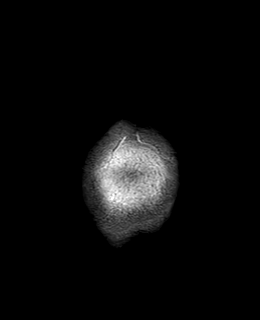
[im 35/35]
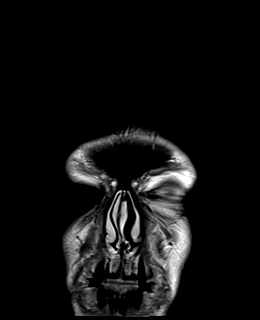

[Series 18: T1 post-contrast · sagittal · 4.0mm · 0.75mm/px · 2 of 31 slices shown (2 of 2)]
[im 1/31]
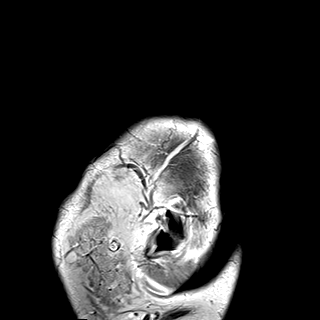
[im 31/31]
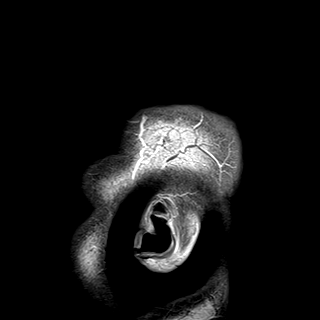

[48 of 48 positions shown; findings below may reference images not displayed]

FINDINGS: Brain: Surgical cavity right occipital lobe stable with mild chronic
blood products. Surrounding T2 and FLAIR hyperintensity around the
surgical cavity is stable. Mild enhancement along the medial wall of
the surgical cavity extending into a septation is stable from the
prior study but improved from 02/06/2019. No new areas of abnormal
enhancement or mass-effect. No remote areas of abnormal enhancement

Ventricle size normal.  No acute infarct.  No midline shift.

Vascular: Normal arterial flow voids

Skull and upper cervical spine: Right parietal craniotomy for tumor
resection

Sinuses/Orbits: Negative

Other: None
IMPRESSION: Stable MRI.  No change from the prior study.

## 2020-03-03 ENCOUNTER — Other Ambulatory Visit: Payer: Self-pay | Admitting: Radiation Therapy

## 2020-03-04 ENCOUNTER — Other Ambulatory Visit: Payer: Self-pay | Admitting: Internal Medicine

## 2020-03-04 NOTE — Telephone Encounter (Signed)
Rx Request 

## 2020-04-04 ENCOUNTER — Other Ambulatory Visit: Payer: Self-pay | Admitting: Internal Medicine

## 2020-04-04 NOTE — Telephone Encounter (Signed)
Rx

## 2020-05-06 ENCOUNTER — Other Ambulatory Visit: Payer: Self-pay | Admitting: Internal Medicine

## 2020-05-12 ENCOUNTER — Other Ambulatory Visit: Payer: Self-pay

## 2020-05-12 ENCOUNTER — Emergency Department (HOSPITAL_COMMUNITY)
Admission: EM | Admit: 2020-05-12 | Discharge: 2020-05-13 | Disposition: A | Discharge status: Home / Self Care | Payer: PRIVATE HEALTH INSURANCE | Attending: Emergency Medicine | Admitting: Emergency Medicine

## 2020-05-12 ENCOUNTER — Encounter (HOSPITAL_COMMUNITY): Payer: Self-pay

## 2020-05-12 DIAGNOSIS — Z5321 Procedure and treatment not carried out due to patient leaving prior to being seen by health care provider: Secondary | ICD-10-CM | POA: Diagnosis not present

## 2020-05-12 DIAGNOSIS — R109 Unspecified abdominal pain: Secondary | ICD-10-CM | POA: Diagnosis present

## 2020-05-12 LAB — URINALYSIS, ROUTINE W REFLEX MICROSCOPIC
Bilirubin Urine: NEGATIVE
Glucose, UA: NEGATIVE mg/dL
Ketones, ur: NEGATIVE mg/dL
Nitrite: NEGATIVE
Protein, ur: NEGATIVE mg/dL
Specific Gravity, Urine: 1.011 (ref 1.005–1.030)
pH: 5 (ref 5.0–8.0)

## 2020-05-12 NOTE — ED Triage Notes (Signed)
Arrived POV from home. Patient reports left flank pain that started around 1800 this evening. Patient states he thinks it is a kidney stone; patient has hx of same.

## 2020-05-13 ENCOUNTER — Other Ambulatory Visit: Payer: Self-pay

## 2020-05-13 ENCOUNTER — Ambulatory Visit
Admission: RE | Admit: 2020-05-13 | Discharge: 2020-05-13 | Disposition: A | Discharge status: Home / Self Care | Payer: PRIVATE HEALTH INSURANCE | Source: Ambulatory Visit | Attending: Internal Medicine | Admitting: Internal Medicine

## 2020-05-13 DIAGNOSIS — C712 Malignant neoplasm of temporal lobe: Secondary | ICD-10-CM

## 2020-05-13 MED ORDER — GADOBENATE DIMEGLUMINE 529 MG/ML IV SOLN
20.0000 mL | Freq: Once | INTRAVENOUS | Status: AC | PRN
  Administered 2020-05-13: 20 mL via INTRAVENOUS

## 2020-05-16 ENCOUNTER — Inpatient Hospital Stay: Payer: PRIVATE HEALTH INSURANCE

## 2020-05-16 ENCOUNTER — Other Ambulatory Visit: Payer: Self-pay

## 2020-05-16 ENCOUNTER — Inpatient Hospital Stay: Payer: PRIVATE HEALTH INSURANCE | Attending: Internal Medicine | Admitting: Internal Medicine

## 2020-05-16 VITALS — BP 137/84 | HR 71 | Temp 97.3°F | Resp 18 | Ht 72.0 in | Wt 248.8 lb

## 2020-05-16 DIAGNOSIS — M129 Arthropathy, unspecified: Secondary | ICD-10-CM | POA: Insufficient documentation

## 2020-05-16 DIAGNOSIS — Z87442 Personal history of urinary calculi: Secondary | ICD-10-CM | POA: Insufficient documentation

## 2020-05-16 DIAGNOSIS — Z923 Personal history of irradiation: Secondary | ICD-10-CM | POA: Diagnosis not present

## 2020-05-16 DIAGNOSIS — Z87891 Personal history of nicotine dependence: Secondary | ICD-10-CM | POA: Insufficient documentation

## 2020-05-16 DIAGNOSIS — R5383 Other fatigue: Secondary | ICD-10-CM | POA: Insufficient documentation

## 2020-05-16 DIAGNOSIS — K760 Fatty (change of) liver, not elsewhere classified: Secondary | ICD-10-CM | POA: Insufficient documentation

## 2020-05-16 DIAGNOSIS — R519 Headache, unspecified: Secondary | ICD-10-CM | POA: Insufficient documentation

## 2020-05-16 DIAGNOSIS — Z79899 Other long term (current) drug therapy: Secondary | ICD-10-CM | POA: Diagnosis not present

## 2020-05-16 DIAGNOSIS — C712 Malignant neoplasm of temporal lobe: Secondary | ICD-10-CM | POA: Diagnosis present

## 2020-05-16 DIAGNOSIS — Z9221 Personal history of antineoplastic chemotherapy: Secondary | ICD-10-CM | POA: Diagnosis not present

## 2020-05-16 DIAGNOSIS — F418 Other specified anxiety disorders: Secondary | ICD-10-CM | POA: Insufficient documentation

## 2020-05-16 DIAGNOSIS — I10 Essential (primary) hypertension: Secondary | ICD-10-CM | POA: Insufficient documentation
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
## 2020-05-17 ENCOUNTER — Telehealth: Payer: Self-pay | Admitting: Internal Medicine

## 2020-05-17 NOTE — Telephone Encounter (Signed)
Scheduled appt per 6/7 los.  Left a vm of the appt date and time

## 2020-06-05 ENCOUNTER — Other Ambulatory Visit: Payer: Self-pay | Admitting: Internal Medicine

## 2020-06-06 NOTE — Telephone Encounter (Signed)
Rx Request 

## 2020-07-11 ENCOUNTER — Other Ambulatory Visit: Payer: Self-pay | Admitting: Internal Medicine

## 2020-07-11 NOTE — Telephone Encounter (Signed)
Rx Request 

## 2020-08-12 ENCOUNTER — Other Ambulatory Visit: Payer: Self-pay | Admitting: Internal Medicine

## 2020-08-29 ENCOUNTER — Other Ambulatory Visit: Payer: Self-pay | Admitting: Radiation Therapy

## 2020-09-15 ENCOUNTER — Other Ambulatory Visit: Payer: Self-pay | Admitting: Internal Medicine

## 2020-09-15 NOTE — Telephone Encounter (Signed)
Rx request 

## 2020-09-16 ENCOUNTER — Ambulatory Visit
Admission: RE | Admit: 2020-09-16 | Discharge: 2020-09-16 | Disposition: A | Discharge status: Home / Self Care | Payer: PRIVATE HEALTH INSURANCE | Source: Ambulatory Visit | Attending: Internal Medicine | Admitting: Internal Medicine

## 2020-09-16 ENCOUNTER — Other Ambulatory Visit: Payer: Self-pay

## 2020-09-16 DIAGNOSIS — C712 Malignant neoplasm of temporal lobe: Secondary | ICD-10-CM

## 2020-09-16 MED ORDER — GADOBENATE DIMEGLUMINE 529 MG/ML IV SOLN
20.0000 mL | Freq: Once | INTRAVENOUS | Status: AC | PRN
  Administered 2020-09-16: 20 mL via INTRAVENOUS

## 2020-09-19 ENCOUNTER — Inpatient Hospital Stay: Payer: PRIVATE HEALTH INSURANCE | Attending: Internal Medicine | Admitting: Internal Medicine

## 2020-09-19 ENCOUNTER — Other Ambulatory Visit: Payer: Self-pay

## 2020-09-19 VITALS — BP 131/88 | HR 74 | Temp 96.5°F | Resp 18 | Ht 72.0 in | Wt 248.6 lb

## 2020-09-19 DIAGNOSIS — F419 Anxiety disorder, unspecified: Secondary | ICD-10-CM | POA: Insufficient documentation

## 2020-09-19 DIAGNOSIS — I10 Essential (primary) hypertension: Secondary | ICD-10-CM | POA: Diagnosis not present

## 2020-09-19 DIAGNOSIS — F329 Major depressive disorder, single episode, unspecified: Secondary | ICD-10-CM | POA: Insufficient documentation

## 2020-09-19 DIAGNOSIS — Z9221 Personal history of antineoplastic chemotherapy: Secondary | ICD-10-CM | POA: Diagnosis not present

## 2020-09-19 DIAGNOSIS — Z79899 Other long term (current) drug therapy: Secondary | ICD-10-CM | POA: Diagnosis not present

## 2020-09-19 DIAGNOSIS — Z87891 Personal history of nicotine dependence: Secondary | ICD-10-CM | POA: Diagnosis not present

## 2020-09-19 DIAGNOSIS — C712 Malignant neoplasm of temporal lobe: Secondary | ICD-10-CM

## 2020-09-19 DIAGNOSIS — Z923 Personal history of irradiation: Secondary | ICD-10-CM | POA: Insufficient documentation

## 2020-09-19 DIAGNOSIS — C713 Malignant neoplasm of parietal lobe: Secondary | ICD-10-CM | POA: Insufficient documentation
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
## 2020-09-20 ENCOUNTER — Telehealth: Payer: Self-pay | Admitting: Internal Medicine

## 2020-09-20 NOTE — Telephone Encounter (Signed)
Scheduled per 10/11 los. Pt is aware of appt time and date.

## 2020-10-19 ENCOUNTER — Other Ambulatory Visit: Payer: Self-pay | Admitting: Internal Medicine

## 2020-10-20 NOTE — Telephone Encounter (Signed)
Rx refill Request

## 2020-12-06 ENCOUNTER — Other Ambulatory Visit: Payer: Self-pay | Admitting: Internal Medicine

## 2020-12-13 ENCOUNTER — Other Ambulatory Visit: Payer: Self-pay | Admitting: Radiation Therapy

## 2021-01-20 ENCOUNTER — Ambulatory Visit
Admission: RE | Admit: 2021-01-20 | Discharge: 2021-01-20 | Disposition: A | Discharge status: Home / Self Care | Payer: PRIVATE HEALTH INSURANCE | Source: Ambulatory Visit | Attending: Internal Medicine | Admitting: Internal Medicine

## 2021-01-20 ENCOUNTER — Other Ambulatory Visit: Payer: Self-pay

## 2021-01-20 DIAGNOSIS — C712 Malignant neoplasm of temporal lobe: Secondary | ICD-10-CM

## 2021-01-20 MED ORDER — GADOBENATE DIMEGLUMINE 529 MG/ML IV SOLN
20.0000 mL | Freq: Once | INTRAVENOUS | Status: AC | PRN
  Administered 2021-01-20: 20 mL via INTRAVENOUS

## 2021-01-23 ENCOUNTER — Other Ambulatory Visit: Payer: Self-pay

## 2021-01-23 ENCOUNTER — Inpatient Hospital Stay: Payer: PRIVATE HEALTH INSURANCE | Attending: Internal Medicine | Admitting: Internal Medicine

## 2021-01-23 ENCOUNTER — Inpatient Hospital Stay: Payer: PRIVATE HEALTH INSURANCE

## 2021-01-23 VITALS — BP 141/86 | HR 77 | Temp 97.0°F | Resp 18 | Ht 72.0 in | Wt 254.2 lb

## 2021-01-23 DIAGNOSIS — Z87891 Personal history of nicotine dependence: Secondary | ICD-10-CM | POA: Insufficient documentation

## 2021-01-23 DIAGNOSIS — R519 Headache, unspecified: Secondary | ICD-10-CM | POA: Insufficient documentation

## 2021-01-23 DIAGNOSIS — F32A Depression, unspecified: Secondary | ICD-10-CM | POA: Insufficient documentation

## 2021-01-23 DIAGNOSIS — C712 Malignant neoplasm of temporal lobe: Secondary | ICD-10-CM

## 2021-01-23 DIAGNOSIS — C713 Malignant neoplasm of parietal lobe: Secondary | ICD-10-CM | POA: Diagnosis present

## 2021-01-23 DIAGNOSIS — K760 Fatty (change of) liver, not elsewhere classified: Secondary | ICD-10-CM | POA: Insufficient documentation

## 2021-01-23 DIAGNOSIS — Z923 Personal history of irradiation: Secondary | ICD-10-CM | POA: Insufficient documentation

## 2021-01-23 DIAGNOSIS — R5383 Other fatigue: Secondary | ICD-10-CM | POA: Insufficient documentation

## 2021-01-23 DIAGNOSIS — Z87442 Personal history of urinary calculi: Secondary | ICD-10-CM | POA: Diagnosis not present

## 2021-01-23 DIAGNOSIS — I10 Essential (primary) hypertension: Secondary | ICD-10-CM | POA: Insufficient documentation

## 2021-01-23 DIAGNOSIS — Z9221 Personal history of antineoplastic chemotherapy: Secondary | ICD-10-CM | POA: Diagnosis not present

## 2021-01-23 DIAGNOSIS — Z79899 Other long term (current) drug therapy: Secondary | ICD-10-CM | POA: Insufficient documentation
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
## 2021-01-24 ENCOUNTER — Other Ambulatory Visit: Payer: Self-pay | Admitting: Radiation Therapy

## 2021-01-24 ENCOUNTER — Telehealth: Payer: Self-pay | Admitting: Internal Medicine

## 2021-01-24 NOTE — Telephone Encounter (Signed)
Scheduled per 02/14 los, patient has been called and voicemail was left. 

## 2021-02-04 ENCOUNTER — Other Ambulatory Visit: Payer: Self-pay | Admitting: Internal Medicine

## 2021-03-31 ENCOUNTER — Other Ambulatory Visit: Payer: Self-pay | Admitting: Internal Medicine

## 2021-04-03 NOTE — Telephone Encounter (Signed)
Rx refill request

## 2021-06-02 ENCOUNTER — Other Ambulatory Visit: Payer: Self-pay | Admitting: Internal Medicine

## 2021-07-14 ENCOUNTER — Encounter: Payer: Self-pay | Admitting: Internal Medicine

## 2021-07-21 ENCOUNTER — Encounter: Payer: Self-pay | Admitting: Internal Medicine

## 2021-07-21 ENCOUNTER — Ambulatory Visit
Admission: RE | Admit: 2021-07-21 | Discharge: 2021-07-21 | Disposition: A | Discharge status: Home / Self Care | Payer: No Typology Code available for payment source | Source: Ambulatory Visit | Attending: Internal Medicine | Admitting: Internal Medicine

## 2021-07-21 DIAGNOSIS — C712 Malignant neoplasm of temporal lobe: Secondary | ICD-10-CM

## 2021-07-21 MED ORDER — GADOBENATE DIMEGLUMINE 529 MG/ML IV SOLN
20.0000 mL | Freq: Once | INTRAVENOUS | Status: AC | PRN
  Administered 2021-07-21: 20 mL via INTRAVENOUS

## 2021-07-24 ENCOUNTER — Other Ambulatory Visit: Payer: Self-pay

## 2021-07-24 ENCOUNTER — Inpatient Hospital Stay: Payer: No Typology Code available for payment source | Attending: Internal Medicine

## 2021-07-24 ENCOUNTER — Inpatient Hospital Stay: Payer: No Typology Code available for payment source | Admitting: Internal Medicine

## 2021-07-24 VITALS — BP 134/81 | HR 67 | Temp 98.0°F | Resp 18 | Wt 257.0 lb

## 2021-07-24 DIAGNOSIS — C719 Malignant neoplasm of brain, unspecified: Secondary | ICD-10-CM | POA: Insufficient documentation

## 2021-07-24 DIAGNOSIS — Z9221 Personal history of antineoplastic chemotherapy: Secondary | ICD-10-CM | POA: Insufficient documentation

## 2021-07-24 DIAGNOSIS — Z923 Personal history of irradiation: Secondary | ICD-10-CM | POA: Insufficient documentation

## 2021-07-24 DIAGNOSIS — C712 Malignant neoplasm of temporal lobe: Secondary | ICD-10-CM
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
## 2021-07-28 ENCOUNTER — Other Ambulatory Visit: Payer: Self-pay | Admitting: Internal Medicine

## 2021-08-01 ENCOUNTER — Encounter: Payer: Self-pay | Admitting: Internal Medicine

## 2021-08-02 ENCOUNTER — Other Ambulatory Visit: Payer: Self-pay | Admitting: Radiation Therapy

## 2021-08-23 ENCOUNTER — Other Ambulatory Visit: Payer: Self-pay | Admitting: Internal Medicine

## 2022-01-19 ENCOUNTER — Other Ambulatory Visit: Payer: Self-pay

## 2022-01-19 ENCOUNTER — Ambulatory Visit (HOSPITAL_COMMUNITY)
Admission: RE | Admit: 2022-01-19 | Discharge: 2022-01-19 | Disposition: A | Discharge status: Home / Self Care | Payer: PRIVATE HEALTH INSURANCE | Source: Ambulatory Visit | Attending: Internal Medicine | Admitting: Internal Medicine

## 2022-01-19 DIAGNOSIS — C712 Malignant neoplasm of temporal lobe: Secondary | ICD-10-CM | POA: Diagnosis not present

## 2022-01-19 MED ORDER — GADOBUTROL 1 MMOL/ML IV SOLN
10.0000 mL | Freq: Once | INTRAVENOUS | Status: AC | PRN
  Administered 2022-01-19: 10 mL via INTRAVENOUS

## 2022-01-22 ENCOUNTER — Inpatient Hospital Stay: Payer: No Typology Code available for payment source

## 2022-01-22 ENCOUNTER — Inpatient Hospital Stay: Payer: No Typology Code available for payment source | Attending: Internal Medicine | Admitting: Internal Medicine

## 2022-01-22 ENCOUNTER — Other Ambulatory Visit: Payer: Self-pay

## 2022-01-22 VITALS — BP 137/92 | HR 79 | Temp 97.6°F | Resp 16 | Wt 253.1 lb

## 2022-01-22 DIAGNOSIS — C712 Malignant neoplasm of temporal lobe: Secondary | ICD-10-CM | POA: Insufficient documentation

## 2022-01-22 DIAGNOSIS — C713 Malignant neoplasm of parietal lobe: Secondary | ICD-10-CM | POA: Diagnosis not present

## 2022-01-22 DIAGNOSIS — Z87891 Personal history of nicotine dependence: Secondary | ICD-10-CM | POA: Diagnosis not present

## 2022-01-22 DIAGNOSIS — I10 Essential (primary) hypertension: Secondary | ICD-10-CM | POA: Diagnosis not present
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
## 2022-01-23 ENCOUNTER — Telehealth: Payer: Self-pay | Admitting: Internal Medicine

## 2022-01-23 NOTE — Telephone Encounter (Signed)
Scheduled per 2/13 los, Attempted to call pt but was unable to reach pt, will mail calender

## 2022-05-15 ENCOUNTER — Other Ambulatory Visit: Payer: Self-pay | Admitting: Internal Medicine

## 2022-05-15 ENCOUNTER — Other Ambulatory Visit: Payer: Self-pay | Admitting: Oncology

## 2022-07-09 ENCOUNTER — Other Ambulatory Visit: Payer: Self-pay | Admitting: Internal Medicine

## 2022-07-09 MED ORDER — DIPHENHYDRAMINE HCL 50 MG PO TABS: 50.0000 mg | ORAL_TABLET | Freq: Once | ORAL | 0 refills | Status: AC

## 2022-07-10 ENCOUNTER — Telehealth: Payer: Self-pay | Admitting: *Deleted

## 2022-07-10 ENCOUNTER — Other Ambulatory Visit: Payer: Self-pay | Admitting: Radiation Therapy

## 2022-07-10 NOTE — Telephone Encounter (Signed)
Patient states he had a rash in the past with IV MRI contrast dye.  Per Dr Mickeal Skinner patient should take Benadryl 1 hour prior to MRI.  Patient states understanding.

## 2022-07-19 ENCOUNTER — Ambulatory Visit (HOSPITAL_COMMUNITY)
Admission: RE | Admit: 2022-07-19 | Discharge: 2022-07-19 | Disposition: A | Discharge status: Home / Self Care | Payer: PRIVATE HEALTH INSURANCE | Source: Ambulatory Visit | Attending: Internal Medicine | Admitting: Internal Medicine

## 2022-07-19 DIAGNOSIS — C712 Malignant neoplasm of temporal lobe: Secondary | ICD-10-CM | POA: Insufficient documentation

## 2022-07-19 MED ORDER — GADOBUTROL 1 MMOL/ML IV SOLN
10.0000 mL | Freq: Once | INTRAVENOUS | Status: AC | PRN
  Administered 2022-07-19: 10 mL via INTRAVENOUS

## 2022-07-23 ENCOUNTER — Other Ambulatory Visit: Payer: Self-pay

## 2022-07-23 ENCOUNTER — Inpatient Hospital Stay: Payer: No Typology Code available for payment source | Attending: Internal Medicine | Admitting: Internal Medicine

## 2022-07-23 ENCOUNTER — Inpatient Hospital Stay: Payer: No Typology Code available for payment source

## 2022-07-23 VITALS — BP 144/81 | HR 77 | Temp 97.5°F | Resp 18 | Wt 249.2 lb

## 2022-07-23 DIAGNOSIS — I10 Essential (primary) hypertension: Secondary | ICD-10-CM | POA: Insufficient documentation

## 2022-07-23 DIAGNOSIS — Z87891 Personal history of nicotine dependence: Secondary | ICD-10-CM | POA: Diagnosis not present

## 2022-07-23 DIAGNOSIS — C712 Malignant neoplasm of temporal lobe: Secondary | ICD-10-CM | POA: Insufficient documentation
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
## 2022-08-02 ENCOUNTER — Ambulatory Visit
Admission: EM | Admit: 2022-08-02 | Discharge: 2022-08-02 | Disposition: A | Discharge status: Home / Self Care | Payer: No Typology Code available for payment source | Attending: Family Medicine | Admitting: Family Medicine

## 2022-08-02 ENCOUNTER — Ambulatory Visit (INDEPENDENT_AMBULATORY_CARE_PROVIDER_SITE_OTHER): Payer: No Typology Code available for payment source

## 2022-08-02 DIAGNOSIS — M545 Low back pain, unspecified: Secondary | ICD-10-CM

## 2022-08-02 MED ORDER — IBUPROFEN 800 MG PO TABS: 800.0000 mg | ORAL_TABLET | Freq: Three times a day (TID) | ORAL | 0 refills | Status: DC | PRN

## 2022-08-02 MED ORDER — KETOROLAC TROMETHAMINE 30 MG/ML IJ SOLN
30.0000 mg | Freq: Once | INTRAMUSCULAR | Status: AC
  Administered 2022-08-02: 30 mg via INTRAMUSCULAR

## 2022-08-02 MED ORDER — TIZANIDINE HCL 4 MG PO TABS: 4.0000 mg | ORAL_TABLET | Freq: Three times a day (TID) | ORAL | 0 refills | Status: DC | PRN

## 2022-08-02 NOTE — ED Triage Notes (Signed)
Pt presents to [facility] with [facility] of low back pain. Pt st he bent over to tie his shoe and felt something pop on the right side. Pt reports he has been taking tylenol.

## 2022-08-02 NOTE — Discharge Instructions (Addendum)
Your x-rays did not show any fractures or bony cyst, but there were some degenerative changes in the lumbar spine  You have been given a shot of Toradol 30 mg today.  Take ibuprofen 800 mg--1 tab every 8 hours as needed for pain.   Take tizanidine 4 mg--1 every 8 hours as needed for muscle spasms  Try heat like a heating pad, or ice  Follow-up with your primary care
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
## 2023-07-08 ENCOUNTER — Telehealth: Payer: Self-pay | Admitting: Internal Medicine

## 2023-07-08 NOTE — Telephone Encounter (Signed)
Called patient regarding August appointment, patient is notified.

## 2023-07-22 ENCOUNTER — Ambulatory Visit (HOSPITAL_COMMUNITY): Payer: No Typology Code available for payment source

## 2023-07-24 ENCOUNTER — Ambulatory Visit (HOSPITAL_COMMUNITY)
Admission: RE | Admit: 2023-07-24 | Discharge: 2023-07-24 | Disposition: A | Discharge status: Home / Self Care | Payer: PRIVATE HEALTH INSURANCE | Source: Ambulatory Visit | Attending: Internal Medicine | Admitting: Internal Medicine

## 2023-07-24 ENCOUNTER — Encounter (HOSPITAL_COMMUNITY): Payer: Self-pay | Admitting: Radiology

## 2023-07-24 DIAGNOSIS — C712 Malignant neoplasm of temporal lobe: Secondary | ICD-10-CM | POA: Diagnosis present

## 2023-07-24 MED ORDER — GADOBUTROL 1 MMOL/ML IV SOLN
10.0000 mL | Freq: Once | INTRAVENOUS | Status: AC | PRN
  Administered 2023-07-24: 10 mL via INTRAVENOUS

## 2023-07-25 ENCOUNTER — Ambulatory Visit: Payer: No Typology Code available for payment source | Admitting: Internal Medicine

## 2023-07-25 ENCOUNTER — Other Ambulatory Visit: Payer: Self-pay | Admitting: *Deleted

## 2023-07-25 ENCOUNTER — Inpatient Hospital Stay: Payer: No Typology Code available for payment source | Attending: Internal Medicine | Admitting: Internal Medicine

## 2023-07-25 ENCOUNTER — Other Ambulatory Visit: Payer: Self-pay

## 2023-07-25 VITALS — BP 151/84 | HR 67 | Temp 97.7°F | Resp 13 | Wt 255.8 lb

## 2023-07-25 DIAGNOSIS — C712 Malignant neoplasm of temporal lobe: Secondary | ICD-10-CM

## 2023-07-25 DIAGNOSIS — C713 Malignant neoplasm of parietal lobe: Secondary | ICD-10-CM | POA: Diagnosis present

## 2023-07-25 DIAGNOSIS — Z87891 Personal history of nicotine dependence: Secondary | ICD-10-CM | POA: Insufficient documentation

## 2023-07-25 DIAGNOSIS — R5383 Other fatigue: Secondary | ICD-10-CM | POA: Insufficient documentation

## 2023-07-25 DIAGNOSIS — Z9221 Personal history of antineoplastic chemotherapy: Secondary | ICD-10-CM | POA: Insufficient documentation
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
## 2024-06-22 ENCOUNTER — Other Ambulatory Visit: Payer: Self-pay | Admitting: *Deleted

## 2024-06-22 ENCOUNTER — Telehealth: Payer: Self-pay | Admitting: *Deleted

## 2024-06-22 DIAGNOSIS — C712 Malignant neoplasm of temporal lobe: Secondary | ICD-10-CM

## 2024-06-22 NOTE — Telephone Encounter (Signed)
 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 Informed patient he may schedule his MRI by [employer], [phone].  Once patient has MRI date, informed him he may call [facility] scheduling to change his F/U appointment with Dr Buckley, [phone].

## 2024-07-07 ENCOUNTER — Telehealth: Payer: Self-pay | Admitting: *Deleted

## 2024-07-07 NOTE — Telephone Encounter (Signed)
 PC to patient, Central Scheduling number given, patient to schedule MRI brain.  Informed patient we will schedule F/U with Dr Buckley once we know his MRI date.  Patient verbalizes understanding.

## 2024-07-12 ENCOUNTER — Ambulatory Visit (HOSPITAL_COMMUNITY)
Admission: RE | Admit: 2024-07-12 | Discharge: 2024-07-12 | Disposition: A | Discharge status: Home / Self Care | Source: Ambulatory Visit | Attending: Internal Medicine | Admitting: Internal Medicine

## 2024-07-12 ENCOUNTER — Encounter (HOSPITAL_COMMUNITY): Payer: Self-pay

## 2024-07-12 DIAGNOSIS — C712 Malignant neoplasm of temporal lobe: Secondary | ICD-10-CM | POA: Diagnosis present

## 2024-07-12 MED ORDER — GADOBUTROL 1 MMOL/ML IV SOLN
10.0000 mL | Freq: Once | INTRAVENOUS | Status: AC | PRN
  Administered 2024-07-12: 10 mL via INTRAVENOUS

## 2024-07-15 ENCOUNTER — Telehealth: Payer: Self-pay

## 2024-07-15 NOTE — Telephone Encounter (Signed)
 Called patient back to get him scheduled for review of MRI scan. Scheduled tomorrow at 10 am and MRI has resulted and been read. Andrea CHRISTELLA Plunk, RN

## 2024-07-16 ENCOUNTER — Inpatient Hospital Stay: Attending: Internal Medicine | Admitting: Internal Medicine

## 2024-07-16 ENCOUNTER — Ambulatory Visit (HOSPITAL_COMMUNITY)

## 2024-07-16 VITALS — BP 139/83 | HR 67 | Temp 97.7°F | Resp 20 | Wt 257.6 lb

## 2024-07-16 DIAGNOSIS — R5383 Other fatigue: Secondary | ICD-10-CM | POA: Insufficient documentation

## 2024-07-16 DIAGNOSIS — C712 Malignant neoplasm of temporal lobe: Secondary | ICD-10-CM | POA: Diagnosis not present

## 2024-07-16 DIAGNOSIS — C713 Malignant neoplasm of parietal lobe: Secondary | ICD-10-CM | POA: Insufficient documentation

## 2024-07-16 DIAGNOSIS — I10 Essential (primary) hypertension: Secondary | ICD-10-CM | POA: Insufficient documentation

## 2024-07-16 DIAGNOSIS — Z79899 Other long term (current) drug therapy: Secondary | ICD-10-CM | POA: Diagnosis not present

## 2024-07-16 DIAGNOSIS — K769 Liver disease, unspecified: Secondary | ICD-10-CM | POA: Insufficient documentation

## 2024-07-16 DIAGNOSIS — M129 Arthropathy, unspecified: Secondary | ICD-10-CM | POA: Diagnosis not present

## 2024-07-16 DIAGNOSIS — Z87442 Personal history of urinary calculi: Secondary | ICD-10-CM | POA: Diagnosis not present

## 2024-07-16 DIAGNOSIS — Z9221 Personal history of antineoplastic chemotherapy: Secondary | ICD-10-CM | POA: Diagnosis not present

## 2024-07-16 DIAGNOSIS — Z87891 Personal history of nicotine dependence: Secondary | ICD-10-CM | POA: Insufficient documentation
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
## 2024-07-23 ENCOUNTER — Inpatient Hospital Stay: Payer: No Typology Code available for payment source | Admitting: Internal Medicine

## 2024-07-24 ENCOUNTER — Telehealth: Payer: Self-pay | Admitting: Internal Medicine

## 2024-07-24 NOTE — Telephone Encounter (Signed)
 Scheduled appointment per 8/7 los. Called and left a VM with appointment details for the patient.

## 2025-07-19 ENCOUNTER — Ambulatory Visit: Admitting: Internal Medicine
